# Patient Record
Sex: Female | Born: 2000 | Race: White | Hispanic: No | Marital: Single | Smoking: Never smoker
Health system: Southern US, Community
[De-identification: ages and names within clinical notes are randomized; demographics above are authoritative.]

## PROBLEM LIST (undated history)

## (undated) DIAGNOSIS — S7012XA Contusion of left thigh, initial encounter: Secondary | ICD-10-CM

## (undated) DIAGNOSIS — G8929 Other chronic pain: Secondary | ICD-10-CM

## (undated) HISTORY — PX: OTHER SURGICAL HISTORY: SHX169

---
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
## 2022-01-07 ENCOUNTER — Ambulatory Visit: Payer: Self-pay | Admitting: Occupational Therapy

## 2022-01-07 DIAGNOSIS — M79641 Pain in right hand: Secondary | ICD-10-CM

## 2022-01-07 DIAGNOSIS — M25531 Pain in right wrist: Secondary | ICD-10-CM

## 2022-01-07 DIAGNOSIS — M79631 Pain in right forearm: Secondary | ICD-10-CM

## 2022-01-07 DIAGNOSIS — G5631 Lesion of radial nerve, right upper limb: Secondary | ICD-10-CM

## 2022-01-07 NOTE — Therapy (Signed)
[facility] [address] [place], [address] Phone: [phone]   Fax:  [phone]  Occupational Therapy Treatment  Patient Details  Name: Erin Parsons MRN: [id number] Date of Birth: May 25, 2001 Referring Provider (OT): DR Candelaria Stagers   Encounter Date: 01/07/2022   OT End of Session - 01/07/22 1106     Visit Number 14    Number of Visits 18    Date for OT Re-Evaluation 01/07/22    OT Start Time 0955    OT Stop Time 1019    OT Time Calculation (min) 24 min    Activity Tolerance Patient tolerated treatment well    Behavior During Therapy [facility] for tasks assessed/performed             No past medical history on file.  No past surgical history on file.  There were no vitals filed for this visit.   Subjective Assessment - 01/07/22 1105     Subjective  I done okay with the 1 pound weight I can feel my wrists getting stronger and did not had any pain.  The only time I had numbness is when I was trying to write again and use my index finger on my mouse pad on my laptop.    Pertinent History Seen DR Avon Gully last week -The patient has ongoing right arm pains of unknown etiology after her dog pulled her right arm about 4 months ago. I explained that she could have radial neuritis versus myofascial pain versus elbow joint mediated pain versus lateral epicondylitis.  - She has done some conservative treatments at the direction of [facility] with persistent symptoms. She has previous normal x-rays of the elbow/forearm/wrist. She has a benign physical exam. I am suspicious for some radial neuritis that is resolving or near completely resolved and now she is dealing with more myofascial pains around the dorsal forearm. She could also have some element of lateral epicondylitis. Based on the intensity of her activities, I recommended prednisone to see if this can help her symptoms and referral to occupational therapy.   - Activity as tolerated. Modify activity as needed according to symptoms. No limitations to weight bearing. No need for immobilization or bracing.  - Refer to PT for pain relieving modalities, manual techniques as indicated, home exercise planning. Continue home exercise program to maintain strength, flexibility, and endurance.  - I prescribed a short course of prednisone. Use Tylenol, anti-inflammatories, topical pain cream, relative rest, massage, and ice/heat as needed for pain.   - Follow up in 8 weeks for reevaluation.    Patient Stated Goals Want the pain better so I can play piano, typing and writing without issues-want to go to college for music and piano    Currently in Pain? No/denies                        Active range of motion for right wrist in all planes within normal limits.  No pain with resistance at end range.  Patient reports she was able to do 3 sets with 1 pound for wrist in all planes pain-free.   Wrist extension and forearm in all planes increasing in strength with no reports of pain and numbness.   Had one episode of numbness over her index finger t with typing on the computer using index finger swiping on mouse pad.   Had 1 episode of pain in radial wrist with writing.    Patient  show more confidence in strength with wrist and forearm resistance by OT.  Patient fatigues still from having injury early last year from her radial nerve and being immobilized on and off with episodes of pain. As well as with fatigue of right upper extremity patient compensating with IADLs and ADLs.         Patient continues to not wear any braces in the last week.   Upgrading change home program for patient to do 2 pound weight for wrist extension, flexion as well as ulnar-radial deviation and pronation supination 1 set sets of 10 1 time a day. If pain-free patient can increase to a second and third set over the next week 1 time a day with 2 pound weight.    Patient continues  to have some tightness in webspace on the right hand with some discomfort on the radial wrist tenderness appears over the [employer]. Assess patient's handwriting again and reviewed again using enlarged grip.  If not patient abducts with thumb keeping pending place as well as she keeps wrist in slight flexion during her writing and sliding over paper. This OT also things that happens with her playing piano and working on the computer wrist extension little weaker since the old injury of the radial nerve causing some numbness. Strengthening should help with the numbness and the collapsing of the wrist in flexion. Patient to work on modifying her writing pinch grip..                        OT Education - 01/07/22 1105     Education Details Progress and changes to home program    Person(s) Educated Patient    Methods Explanation;Demonstration;Tactile cues;Verbal cues;Handout    Comprehension Verbal cues required;Returned demonstration;Verbalized understanding                 OT Long Term Goals - 11/26/21 1821       OT LONG TERM GOAL #1   Title Pt pain in R arm decrease for pt to wean out of splint and have symptoms less than 2 x day    Baseline painat eval constant with use and 4-5/10 - no pain except playing 45 min intense piano piece and writing for 30 min - 1-2 x day maybeNOW patient return with increased pain on radial wrist and dorsal hand 4/10 pain, 3/10 pain in webspace, patient fitted with a wrist splint again we will assess if needed thumb spica.    Time 4    Period Weeks    Status On-going    Target Date 12/31/21      OT LONG TERM GOAL #2   Title Pain in R arm decrease to 1 -2 episodes a  wk and no stiffness    Baseline Patient reports pain to-5/10 over radial wrist into dorsal hand to second digit and thumb.  3/10 pain and tightness in webspace, pain at rest today for/10 pain pain with radial deviation wrist extension and flexion    Time 6    Period Weeks     Status On-going    Target Date 01/07/22                   Plan - 01/07/22 1107     Clinical Impression Statement Pt  was seen last year end for diagnosis of R dominant forearm pain/radial N neuritis-  In March/April 22 she was holding onto her dogs leash and got pulled - symptoms increased  as she started back doing her activities- she reports 4-5/10 pain in dorsal and radial forearm, wrist and dorsal thumb thru 2nd digit after repetitive digits extention, static wrist extention,pronation position and thumb RA in acitivities like playing piano, typing, writing at eval. She made great progress and recover about 90 % - when to college and had scholarship for music.  Patient reports the month before she went to college she had to play a lot of chest and recitals and had increased pain in wrist and hand.  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  Patient was evaluated 4-5 weeks ago after not seen 5 months with reports of increased pain over the radial wrist and dorsal hand, with some numbness at times in second digit and thumb.  NOW patient showing great progress in active range of motion and resistance in wrist for all planes was able to upgrade patient to 2 pound weight in all planes pain-free.  Patient can increase in over the next few weeks to a second or third set once a day.  Patient continues to have some numbness with working on the computer and second digit as well as writing for long.Marland Kitchen  Appears patient irritates the radial nerve sensory branch.  Patient continues to have a modified pencil grip with abduction of the thumb and hyperflexion of the IP which cause tightness in the webspace discomfort over the radial wrist with palmar radial abduction.  And wrist extension weakness with writing and typing patient's wrist continues to drop or she keeps it in a slight flexion.  Should improve as patient's wrist is  getting stronger and patient can modify her pencil grip and maintain wrist neutral position during typing on computer use..  Patient also to get with tennis coach about her grip during serving-patient snaps wrist.  With patient having nerve injury about a year ago and being immobilized on and off with pain and sensory changes patient has decreased strength in right upper extremity compensating with functional task.  Patient limited in functional use of right dominant hand and playing piano, writing, working on computer and everyday ADLs and IADLs.  Patient can benefit from OT services    OT Occupational Profile and History Problem Focused Assessment - Including review of records relating to presenting problem    Occupational performance deficits (Please refer to evaluation for details): IADL's;Rest and Sleep;Play;Leisure;Social Participation    Body Structure / Function / Physical Skills Strength;Pain;UE functional use;IADL;Fascial restriction;Sensation;Flexibility;Decreased knowledge of precautions    Rehab Potential Good    Clinical Decision Making Limited treatment options, no task modification necessary    Comorbidities Affecting Occupational Performance: None    Modification or Assistance to Complete Evaluation  No modification of tasks or assist necessary to complete eval    OT Frequency 1x / week    OT Duration 6 weeks    OT Treatment/Interventions Self-care/ADL training;Contrast Bath;Ultrasound;Manual Therapy;Patient/family education;Therapeutic activities;Splinting;Iontophoresis;Fluidtherapy    Consulted and Agree with Plan of Care Patient             Patient will benefit from skilled therapeutic intervention in order to improve the following deficits and impairments:   Body Structure / Function / Physical Skills: Strength, Pain, UE functional use, IADL, Fascial restriction, Sensation, Flexibility, Decreased knowledge of precautions       Visit Diagnosis: Pain in right  forearm  Pain in right hand  Pain in right wrist  Radial  nerve dysfunction, right    Problem List There are no problems to display for this patient.   Rosalyn Gess, OTR/L,CLT 01/07/2022, 11:14 AM  [facility] [address] [place], [address] Phone: [phone]   Fax:  [phone]  Name: Erin Parsons MRN: [id number] Date of Birth: 08-28-2000

## 2022-01-14 ENCOUNTER — Ambulatory Visit: Payer: Self-pay | Admitting: Occupational Therapy

## 2022-01-14 DIAGNOSIS — M79631 Pain in right forearm: Secondary | ICD-10-CM

## 2022-01-14 DIAGNOSIS — M25531 Pain in right wrist: Secondary | ICD-10-CM

## 2022-01-14 DIAGNOSIS — G5631 Lesion of radial nerve, right upper limb: Secondary | ICD-10-CM

## 2022-01-14 DIAGNOSIS — M79641 Pain in right hand: Secondary | ICD-10-CM

## 2022-01-14 NOTE — Therapy (Signed)
[place] [facility] PHYSICAL AND SPORTS MEDICINE [address] [place], [place], [address] Phone: [phone]   Fax:  [phone]  Occupational Therapy Treatment  Patient Details  Name: Erin DarnerCarolyn Sam MRN: [id number] Date of Birth: 04/04/2001 Referring Provider (OT): DR Landry MellowKubinski   Encounter Date: 01/14/2022   OT End of Session - 01/14/22 1340     Visit Number 15    Number of Visits 18    Date for OT Re-Evaluation 02/25/22    OT Start Time 1314    OT Stop Time 1340    OT Time Calculation (min) 26 min    Activity Tolerance Patient tolerated treatment well    Behavior During Therapy [facility] for tasks assessed/performed             No past medical history on file.  No past surgical history on file.  There were no vitals filed for this visit.   Subjective Assessment - 01/14/22 1338     Subjective  I done the 2 pounds is okay but I did not go up to a second or third set.  I was doing fine and then Saturday my shoulder hurt and then went down to my forearm.  But I do not know what I done.  And then the next day I felt some numbness in my forearm, fingers.  But today is fine. I can't really remember what I have done to cause it.   [facility]    Pertinent History Seen DR Dola FactorKubunski last week -The patient has ongoing right arm pains of unknown etiology after her dog pulled her right arm about 4 months ago. I explained that she could have radial neuritis versus myofascial pain versus elbow joint mediated pain versus lateral epicondylitis.  - She has done some conservative treatments at the direction of [facility] with persistent symptoms. She has previous normal x-rays of the elbow/forearm/wrist. She has a benign physical exam. I am suspicious for some radial neuritis that is resolving or near completely resolved and now she is dealing with more myofascial pains around the dorsal forearm. She could also have some element of lateral  epicondylitis. Based on the intensity of her activities, I recommended prednisone to see if this can help her symptoms and referral to occupational therapy.  - Activity as tolerated. Modify activity as needed according to symptoms. No limitations to weight bearing. No need for immobilization or bracing.  - Refer to PT for pain relieving modalities, manual techniques as indicated, home exercise planning. Continue home exercise program to maintain strength, flexibility, and endurance.  - I prescribed a short course of prednisone. Use Tylenol, anti-inflammatories, topical pain cream, relative rest, massage, and ice/heat as needed for pain.   - Follow up in 8 weeks for reevaluation.    Patient Stated Goals Want the pain better so I can play piano, typing and writing without issues-want to go to college for music and piano    Currently in Pain? No/denies                Active range of motion for right wrist  and forearm in all planes within normal limits.  No pain with resistance at end range.  Patient reports she did not increase to 3 sets with a 2 pound weight.  Still doing 1 set of 2 pound weight 3-4 times this past week.   Strength in wrist and forearm in all planes increasing  with no reports  of pain and numbness this date.   Had one episode of numbness over her index finger with the writing but not as bad as in the past.  She do report that that symptoms was the day after her shoulder bother her little bit and the pain went down to her elbow dorsal forearm.  Could not related to any specific activity.      Patient show more confidence in strength with wrist and forearm resistance by OT.  As well as less strength with gripping and not collapsing in wrist flexion except with supination resistance, gripping tennis racquet or writing-appear patient over gripping distally to compensate for proximal strength at shoulder.   Patient fatigues still from having injury early last year from her radial nerve  and being immobilized on and off with episodes of pain. Shoulder active range of motion within normal limits but patient did report having some symptoms the last time she seen Dr. Landry Mellow. But did not show any symptoms or mention any issues the last 6 weeks.  Patient continues to not wear any braces in the last week.   Continue with same home program for patient to do 2 pound weight for wrist extension, flexion as well as ulnar-radial deviation and pronation supination but patient to do 3 sets of 12   Tightness and pain in webspace on the right hand improved greatly with no pain with thumb palmar, radial abduction as well as flexion.    Assess patient's handwriting last week reviewed again using enlarged grip.  If not patient abducts with thumb keeping pending place as well as she keeps wrist in slight flexion during her writing and sliding over paper. This OT also things that happens with her playing piano and working on the computer wrist extension little weaker since the old injury of the radial nerve causing some numbness. Strengthening should help with the numbness and the collapsing of the wrist in flexion. Patient wrist and forearm increase in strength but shoulder and proximal stability is needed would recommend at this time a PT evaluation and treat.  We will send order to Dr. Landry Mellow for PT                    OT Education - 01/14/22 1340     Education Details Progress and changes to home program    Person(s) Educated Patient    Methods Explanation;Demonstration;Tactile cues;Verbal cues;Handout    Comprehension Verbal cues required;Returned demonstration;Verbalized understanding                 OT Long Term Goals - 01/14/22 1734       OT LONG TERM GOAL #1   Title Pt pain in R arm decrease for pt to wean out of splint and have symptoms less than 2 x day    Baseline patient return with increased pain on radial wrist and dorsal hand 4/10 pain, 3/10 pain in  webspace, patient fitted with a wrist splint again we will assess if needed thumb spica. NOW patient not wearing any splints for the last 2 weeks show increase strength and 2 days out of the week with some increased symptoms of numbness more than pain.    Status Achieved      OT LONG TERM GOAL #2   Title Pain in R arm decrease to 1 -2 episodes a  wk and no stiffness    Baseline Patient reports pain to-5/10 over radial wrist into dorsal hand to second digit and thumb.  3/10 pain and tightness in webspace, pain at rest today 4/10 pain pain with radial deviation wrist extension and flexion NOW patient reports no stiffness no pain last few sessions coming in, no wearing of splints.  Patient has some numbness about 2 days a week    Status Achieved      OT LONG TERM GOAL #3   Title Right wrist and forearm strength increased to 5/5 for patient to not collapse into wrist flexion and be able to stabilize wrist neutral with using right dominant hand and IADLs and ADLs    Baseline Patient able to stabilize wrist better at this time compared to 4 to 6 weeks ago.  Still still compensates with wrist flexion during supination as well as playing tennis or writing not able to stabilize wrist neutrally.    Time 6    Period Weeks    Status New    Target Date 03/04/22                   Plan - 01/14/22 1341     Clinical Impression Statement Pt  was seen last year end for diagnosis of R dominant forearm pain/radial N neuritis-  In March/April 22 she was holding onto her dogs leash and got pulled - symptoms increased  as she started back doing her activities- she reports 4-5/10 pain in dorsal and radial forearm, wrist and dorsal thumb thru 2nd digit after repetitive digits extention, static wrist extention,pronation position and thumb RA in acitivities like playing piano, typing, writing at eval. She made great progress and recover about 90 % - when to college and had scholarship for music.  Patient reports  the month before she went to college she had to play a lot of practice and recitals and had increased pain in wrist and hand.  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  Patient was evaluated 6 weeks ago after not seen 5 months with reports of increased pain over the radial wrist and dorsal hand, with some numbness at times in second digit and thumb.  NOW patient showing great progress in active range of motion for wrist and forearm without increase symptoms-and strength improving to 4/5 doing 2 pound weight pain-free and able to stabilize wrist better.  Upon further assessment.  Patient do have some of her symptoms increase after doing something like serving in tennis, on the computer/playing piano or writing for long time.  Appear patient over gripping objects secondary to decreased strength and unable to stabilize proximally.  Shoulder active range of motion is within normal limits but patient did mention when seen by Dr. Landry Mellow last time that her shoulder was bothering her but did not mention it last 6 weeks.  Would recommend PT evaluation at this time to address proximal strength and stability to decrease possibly compensating distally with the right dominant hand.  Pt has been favoring R UE more than year from original injury. Patient limited in functional use of right dominant hand playing  tennis, piano, writing, working on computer and everyday ADLs and IADLs.  Patient can benefit from cont OT services    OT Occupational Profile and History Problem Focused Assessment - Including review of records relating to presenting problem    Occupational performance deficits (Please refer to evaluation for details): IADL's;Rest and Sleep;Play;Leisure;Social Participation    Body Structure / Function / Physical Skills Strength;Pain;UE functional use;IADL;Fascial restriction;Sensation;Flexibility;Decreased knowledge  of precautions     Rehab Potential Good    Clinical Decision Making Limited treatment options, no task modification necessary    Comorbidities Affecting Occupational Performance: None    Modification or Assistance to Complete Evaluation  No modification of tasks or assist necessary to complete eval    OT Frequency 1x / week   decrease to biweekly as progress   OT Duration 6 weeks    OT Treatment/Interventions Self-care/ADL training;Contrast Bath;Ultrasound;Manual Therapy;Patient/family education;Therapeutic activities;Splinting;Iontophoresis;Fluidtherapy    Consulted and Agree with Plan of Care Patient             Patient will benefit from skilled therapeutic intervention in order to improve the following deficits and impairments:   Body Structure / Function / Physical Skills: Strength, Pain, UE functional use, IADL, Fascial restriction, Sensation, Flexibility, Decreased knowledge of precautions       Visit Diagnosis: Pain in right forearm - Plan: Ot plan of care cert/re-cert  Pain in right hand - Plan: Ot plan of care cert/re-cert  Pain in right wrist - Plan: Ot plan of care cert/re-cert  Radial nerve dysfunction, right - Plan: Ot plan of care cert/re-cert    Problem List There are no problems to display for this patient.   Oletta Cohn, OTR/L,CLT 01/14/2022, 5:43 PM  [place] [facility] [place] PHYSICAL AND SPORTS MEDICINE [address], [place], [address] Phone: [phone]   Fax:  [phone]  Name: Marry Kusch MRN: [id number] Date of Birth: 05-18-2001

## 2022-01-21 ENCOUNTER — Ambulatory Visit: Payer: Self-pay | Admitting: Occupational Therapy

## 2022-01-23 ENCOUNTER — Ambulatory Visit: Payer: Self-pay | Attending: Sports Medicine | Admitting: Occupational Therapy

## 2022-01-23 DIAGNOSIS — M79641 Pain in right hand: Secondary | ICD-10-CM | POA: Insufficient documentation

## 2022-01-23 DIAGNOSIS — M25611 Stiffness of right shoulder, not elsewhere classified: Secondary | ICD-10-CM | POA: Insufficient documentation

## 2022-01-23 DIAGNOSIS — G5631 Lesion of radial nerve, right upper limb: Secondary | ICD-10-CM | POA: Insufficient documentation

## 2022-01-23 DIAGNOSIS — M79631 Pain in right forearm: Secondary | ICD-10-CM | POA: Insufficient documentation

## 2022-01-23 DIAGNOSIS — M25531 Pain in right wrist: Secondary | ICD-10-CM | POA: Insufficient documentation

## 2022-01-26 ENCOUNTER — Encounter: Payer: Self-pay | Admitting: Occupational Therapy

## 2022-01-26 NOTE — Therapy (Signed)
[place] [facility] PHYSICAL AND SPORTS MEDICINE [address] [place], [address] Phone: [phone]   Fax:  [phone]  Occupational Therapy Treatment  Patient Details  Name: Erin Parsons MRN: [id number] Date of Birth: [birth year] Referring Provider (OT): DR Landry Mellow   Encounter Date: 01/23/2022   OT End of Session - 01/26/22 2008     Visit Number 16    Number of Visits 18    Date for OT Re-Evaluation 02/25/22    OT Start Time 0945    OT Stop Time 1029    OT Time Calculation (min) 44 min    Activity Tolerance Patient tolerated treatment well    Behavior During Therapy [facility] for tasks assessed/performed             History reviewed. No pertinent past medical history.  History reviewed. No pertinent surgical history.  There were no vitals filed for this visit.   Subjective Assessment - 01/26/22 2008     Pertinent History Seen DR Dola Factor last week -The patient has ongoing right arm pains of unknown etiology after her dog pulled her right arm about 4 months ago. I explained that she could have radial neuritis versus myofascial pain versus elbow joint mediated pain versus lateral epicondylitis.  - She has done some conservative treatments at the direction of [facility] with persistent symptoms. She has previous normal x-rays of the elbow/forearm/wrist. She has a benign physical exam. I am suspicious for some radial neuritis that is resolving or near completely resolved and now she is dealing with more myofascial pains around the dorsal forearm. She could also have some element of lateral epicondylitis. Based on the intensity of her activities, I recommended prednisone to see if this can help her symptoms and referral to occupational therapy.  - Activity as tolerated. Modify activity as needed according to symptoms. No limitations to weight bearing. No need for immobilization or bracing.  - Refer to PT for pain relieving modalities, manual  techniques as indicated, home exercise planning. Continue home exercise program to maintain strength, flexibility, and endurance.  - I prescribed a short course of prednisone. Use Tylenol, anti-inflammatories, topical pain cream, relative rest, massage, and ice/heat as needed for pain.   - Follow up in 8 weeks for reevaluation.    Patient Stated Goals Want the pain better so I can play piano, typing and writing without issues-want to go to college for music and piano    Currently in Pain? Yes    Pain Score 0-No pain           Rationale for Evaluation and Treatment Rehabilitation   Reports no pain today, had a good week, did not get to do any tennis this week and so she has not been able to get with her coach yet.  Was able work up to  performing 3 sets of 12 this week of the 2# exercises this week.    Fluidotherapy to right hand for 12 mins prior to therapeutic exercises to increase tissue mobility and decrease incidence of pain.    Strength in wrist and forearm in all planes increasing  with no reports of pain and numbness.     Patient continues to not wear any braces in the last week.   Since she just worked up to 3 sets of 12 reps this week and now has no pain, recommend continuing with  2 pound weight for wrist extension, flexion as well as ulnar-radial deviation and pronation supination but patient  to perform 3 sets of 15.     Tightness and pain in webspace on the right hand improved greatly with no pain with thumb palmar, radial abduction as well as flexion.    Handwriting: Pt assessed for grasping patterns with handwriting, she tends to produce a thumb wrap grip on pen and which does not allow for much isolated movement at the thumb.  Performed handwriting in the clinic with large red grip foam to pen for name, printed and script.  She demonstrates decreased precision of movements and decreased legibility.  Recommend she continue with red foam at home to help reinforce grip, keep pain  decreased but also to add movement of opposition of thumb and index for "O" then into egg shape pushing tips of fingers forward and back.  Pt able to demonstrate exercise which should help promote facilitation of tripod gripping pattern with movement for handwriting.  Also recommend she perform handwriting in short increments. Therapist to look at [facility] to see if we have any large pen grips she can transition to in the future to use while in class.     Patient wrist and forearm increase in strength but shoulder and proximal stability is needed would recommend at this time a PT evaluation and treat.   Pt still awaiting order to Dr. Landry Mellow for PT                         OT Long Term Goals - 01/14/22 1734       OT LONG TERM GOAL #1   Title Pt pain in R arm decrease for pt to wean out of splint and have symptoms less than 2 x day    Baseline patient return with increased pain on radial wrist and dorsal hand 4/10 pain, 3/10 pain in webspace, patient fitted with a wrist splint again we will assess if needed thumb spica. NOW patient not wearing any splints for the last 2 weeks show increase strength and 2 days out of the week with some increased symptoms of numbness more than pain.    Status Achieved      OT LONG TERM GOAL #2   Title Pain in R arm decrease to 1 -2 episodes a  wk and no stiffness    Baseline Patient reports pain to-5/10 over radial wrist into dorsal hand to second digit and thumb.  3/10 pain and tightness in webspace, pain at rest today 4/10 pain pain with radial deviation wrist extension and flexion NOW patient reports no stiffness no pain last few sessions coming in, no wearing of splints.  Patient has some numbness about 2 days a week    Status Achieved      OT LONG TERM GOAL #3   Title Right wrist and forearm strength increased to 5/5 for patient to not collapse into wrist flexion and be able to stabilize wrist neutral with using right dominant hand and  IADLs and ADLs    Baseline Patient able to stabilize wrist better at this time compared to 4 to 6 weeks ago.  Still still compensates with wrist flexion during supination as well as playing tennis or writing not able to stabilize wrist neutrally.    Time 6    Period Weeks    Status New    Target Date 03/04/22                   Plan - 01/26/22 2009     Clinical  Impression Statement Pt  was seen last year end for diagnosis of R dominant forearm pain/radial N neuritis-  In March/April 22 she was holding onto her dogs leash and got pulled - symptoms increased  as she started back doing her activities- she reports 4-5/10 pain in dorsal and radial forearm, wrist and dorsal thumb thru 2nd digit after repetitive digits extention, static wrist extention,pronation position and thumb RA in acitivities like playing piano, typing, writing at eval. She made great progress and recover about 90 % - when to college and had scholarship for music.  Patient reports the month before she went to college she had to play a lot of practice and recitals and had increased pain in wrist and hand.  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  Patient was evaluated 6 weeks ago after not seen 5 months with reports of increased pain over the radial wrist and dorsal hand, with some numbness at times in second digit and thumb.  Pt with no pain this date and reports progress overall, she did not get to engage in tennis or talk with coach about her tennis movement patterns.    Appear patient over gripping objects secondary to decreased strength and unable to stabilize proximally.  Pt continues to report shoulder weakness.  She demonstrates a thumb wrap grip on pen for handwriting and would benefit from large grip pen with progression of grips over time to improve strength and precision of thumb and index tripod grasp as well as improving  legibility.    Would recommend PT evaluation at this time to address proximal strength and stability to decrease possibly compensating distally with the right dominant hand.  Pt has been favoring R UE more than year from original injury. Patient limited in functional use of right dominant hand playing  tennis, piano, writing, working on computer and everyday ADLs and IADLs.  Patient can benefit from cont OT services    OT Occupational Profile and History Problem Focused Assessment - Including review of records relating to presenting problem    Occupational performance deficits (Please refer to evaluation for details): IADL's;Rest and Sleep;Play;Leisure;Social Participation    Body Structure / Function / Physical Skills Strength;Pain;UE functional use;IADL;Fascial restriction;Sensation;Flexibility;Decreased knowledge of precautions    Rehab Potential Good    Clinical Decision Making Limited treatment options, no task modification necessary    Comorbidities Affecting Occupational Performance: None    Modification or Assistance to Complete Evaluation  No modification of tasks or assist necessary to complete eval    OT Frequency 1x / week   decrease to biweekly as progress   OT Duration 6 weeks    OT Treatment/Interventions Self-care/ADL training;Contrast Bath;Ultrasound;Manual Therapy;Patient/family education;Therapeutic activities;Splinting;Iontophoresis;Fluidtherapy    Consulted and Agree with Plan of Care Patient             Patient will benefit from skilled therapeutic intervention in order to improve the following deficits and impairments:   Body Structure / Function / Physical Skills: Strength, Pain, UE functional use, IADL, Fascial restriction, Sensation, Flexibility, Decreased knowledge of precautions       Visit Diagnosis: Pain in right forearm  Pain in right hand  Pain in right wrist  Radial nerve dysfunction, right    Problem List There are no problems to display for this  patient.  Arvis Miguez Cornelius Moras, OTR/L, CLT  Aerin Delany, OT 01/26/2022, 8:33 PM  [place] [facility]  PHYSICAL AND SPORTS MEDICINE [address] , [place], [address] Phone: [phone]   Fax:  [phone]  Name: Erin Parsons MRN: [id number] Date of Birth: 10/21/2000

## 2022-02-04 ENCOUNTER — Ambulatory Visit: Payer: Self-pay | Admitting: Occupational Therapy

## 2022-02-11 ENCOUNTER — Ambulatory Visit: Payer: Self-pay | Admitting: Occupational Therapy

## 2022-02-11 DIAGNOSIS — M25531 Pain in right wrist: Secondary | ICD-10-CM

## 2022-02-11 DIAGNOSIS — M79641 Pain in right hand: Secondary | ICD-10-CM

## 2022-02-11 DIAGNOSIS — M79631 Pain in right forearm: Secondary | ICD-10-CM

## 2022-02-11 DIAGNOSIS — G5631 Lesion of radial nerve, right upper limb: Secondary | ICD-10-CM

## 2022-02-14 ENCOUNTER — Ambulatory Visit: Payer: Self-pay | Admitting: Physical Therapy

## 2022-02-14 ENCOUNTER — Encounter: Payer: Self-pay | Admitting: Physical Therapy

## 2022-02-14 DIAGNOSIS — M25611 Stiffness of right shoulder, not elsewhere classified: Secondary | ICD-10-CM

## 2022-02-14 NOTE — Therapy (Signed)
[facility] [address], [place], [address] Phone: [phone]   Fax:  [phone]  Physical Therapy Evaluation  Patient Details  Name: Erin Parsons MRN: [id number] Date of Birth: Aug 29, 2000 No data recorded  Encounter Date: 02/14/2022     History reviewed. No pertinent past medical history.  History reviewed. No pertinent surgical history.  There were no vitals filed for this visit.    Subjective Assessment - 02/14/22 1002     Pertinent History Patinet is a 21 year old female familiar with this clinic from recenting d/c OT for R radial neuritis. She had some shoulder pain at beginning of OT, but says it has gotten worse since she has not been using it as much due to healing from neutritis. Pain is localized to superior/posterior shoulder and UT. Reports this is achy and reports her UT feels really tight. She cannot localize any activities that make her pain better or worse. Current and worst pain 1/10; best 0/10. Has not tried any modalities to help with shoulder discomfort . She notices some weakness in her R shoulder, and that it is lower than her L shoulder, and she feels like she is weak when serving for tennis. Pt is attending [employer] this August. 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 Pt is R handed.Still has some numbness from neurtiis with writing or playing piano for extended period of time Pt denies N/V, B&B changes, unexplained weight fluctuation, saddle paresthesia, fever, night sweats, or unrelenting night pain at this time.    Limitations Lifting;House hold activities;Writing    How long can you sit comfortably? unlimited with support, without d/t "feeling like I have to old my shoulder up"    How long can you stand comfortably? unlimited    How long can you walk comfortably? unlimited    Diagnostic tests none    Patient Stated Goals increase R  soulder strength    Currently in Pain? Yes    Pain Score 1     Pain Location Shoulder    Pain Orientation Right;Upper;Posterior    Pain Descriptors / Indicators Aching;Dull    Pain Type Chronic pain    Pain Radiating Towards none; some numbess elbow down fingers from radial neuritis    Pain Onset More than a month ago    Pain Frequency Intermittent    Aggravating Factors  sitting mre than , serving in tennis    Pain Relieving Factors none    Effect of Pain on Daily Activities unable to play tennis or sit without support                OBJECTIVE  MUSCULOSKELETAL: Tremor: Normal Bulk: Normal Tone: Normal  Cervical Screen AROM: WFL and painless with overpressure in all planes Spurlings A (ipsilateral lateral flexion/axial compression): R: Negative L: Negative Spurlings B (ipsilateral lateral flexion/contralateral rotation/axial compression): R: Negative L: Negative Repeated movement: No centralization or peripheralization with protraction or retraction  Elbow Screen Elbow AROM: pain free; slight hypermobility in ext  Palpation TTP with concordant pain sign to R UT and R levator with trigger points and tension  Observation: decreased R shoulder height, slight increased thoracic kyphosis FHRS With overhead motion: scapular dyskinesis increased scapular elevation  Strength R/L 5/5 Shoulder flexion (anterior deltoid/pec major/coracobrachialis, axillary n. (C5-6) and musculocutaneous n. (C5-7)) 5/5 Shoulder abduction (deltoid/supraspinatus, axillary/suprascapular n, C5) 5/5 Shoulder external rotation (infraspinatus/teres minor) 5/5 Shoulder internal rotation (subcapularis/lats/pec  major) 5/5 Shoulder extension (posterior deltoid, lats, teres major, axillary/thoracodorsal n.) 5/5 Elbow flexion (biceps brachii, brachialis, brachioradialis, musculoskeletal n, C5-6) 5/5 Shrug UT 4/5 Y lower trap 4+/5 T scapular retractors  AROM R/L All AROM  (bilat shoulder,  scapulae, cervical and thoracic spine) within normal limits some shoulder hypermobility noted in all directions  Beighton scale 2/9 Elbow 2 Pinky 0 Thumb 0 Knees 0 Trunk 0  *Indicates pain, overpressure performed unless otherwise indicated  PROM R/L 190/190 Shoulder flexion >180d bilat Shoulder abduction 109/107 Shoulder external rotation 90d bilat Shoulder internal rotation Beyond 60d bilat Shoulder extension *Indicates pain, overpressure performed unless otherwise indicated  Accessory Motions/Glides Glenohumeral: Posterior: R: normal L: normal Inferior: R: normal L: normal Anterior: R: normal L: normal Laxity with all joint motions  Scapulothoracic: Distraction: R: normal L: normal Medial: R: normal L: normal Lateral: R: normal L: normal Inferior: R: normal L: normal Superior: R: normal L: normal  Muscle Length Testing Pectoralis Major: R: normal L: normal Pectoralis Minor: R: normal L: normal Biceps: R: normal L: normal  NEUROLOGICAL: Sensation Grossly intact to light touch bilateral UE as determined by testing dermatomes C2-T2 Proprioception and hot/cold testing deferred on this date  Special tests Subacromial Impingement Hawkins-Kennedy: Negative Neer (Block scapula, PROM flexion): Negative Painful Arc (Pain from 60 to 120 degrees scaption): Negative Empty Can: Negative External Rotation Resistance: Negative Horizontal Adduction: Negative Scapular Assist: Negative Positive Hawkins-Kennedy, Painful arc sign, Infraspinatus muscle test then +LR: 10.56 of some type of impingement present, 2/3 tests: +LR 5.06, -LR 0.17, Positive 3/5 Hawkins-Kennedy, neer, painful arc, empty can, and external rotation resistance then SN: .75 (.54-.96) SP: .74 (.61-.88) +LR: 2.93 (1.60-5.36) -LR: .34 (.14-.80)    Shoulder Instability Sulcus Sign: Negative Anterior Apprehension: Negative  Ther-Ex PT reviewed the following HEP with patient with patient able to demonstrate a  set of the following with min cuing for correction needed. PT educated patient on parameters of therex (how/when to inc/decrease intensity, frequency, rep/set range, stretch hold time, and purpose of therex) with verbalized understanding.   Access Code: [email or web address] - Seated Cervical Sidebending Stretch  - 2-3 x daily - 7 x weekly - 30-60sec hold - Seated Levator Scapulae Stretch  - 2-3 x daily - 7 x weekly - 30-60sec hold - Prone Single Arm Shoulder Y with Dumbbell  - 1 x daily - 1-2 x weekly - 3 sets - 6-10 reps - Shoulder Extension with Resistance  - 1 x daily - 1-2 x weekly - 3 sets - 6-10 reps                 Objective measurements completed on examination: See above findings.                PT Education - 02/14/22 1011     Education Details Patient was educated on diagnosis, anatomy and pathology involved, prognosis, role of PT, and was given an HEP, demonstrating exercise with proper form following verbal and tactile cues, and was given a paper hand out to continue exercise at home. Pt was educated on and agreed to plan of care.    Person(s) Educated Patient    Methods Explanation;Demonstration;Tactile cues;Verbal cues;Handout    Comprehension Verbalized understanding;Returned demonstration;Verbal cues required;Tactile cues required              PT Short Term Goals - 02/14/22 1044       PT SHORT TERM GOAL #1   Title Pt will be independent with HEP in order to improve  strength and decrease pain in order to improve pain-free function at home and work.    Baseline 02/14/22 HEP given    Time 4    Period Weeks    Status New               PT Long Term Goals - 02/14/22 1045       PT LONG TERM GOAL #1   Title Pt will demonstrate gross periscapular strength of 5/5 in order to demonstrate PLOF and strength needed for tennis serve    Baseline 02/14/22 Y lower trap 4/5; T scap retractors 4+/5    Time 8    Period Weeks    Status New      PT LONG TERM  GOAL #2   Title Patient will increase FOTO score to 77  to demonstrate predicted increase in function to complete ADLs    Baseline 02/14/22 55    Time 8    Period Weeks    Status New                    Plan - 02/14/22 1046     Clinical Impression Statement Pt is an 21 year old female presenting with R shoulder pain and weakness following disuse from radial neuritis. Impairments in decreased periscapular strengt, decreased power, scapular diskinesis, hypermobility of bilat scapulohumeral complex, abnormal posture, increased tension/trigger points of UT and levator scapulae, and pain. 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 Pt will benefit from skilled PT to address aforementioned impairments to return to optimal PLOF    Personal Factors and Comorbidities Time since onset of injury/illness/exacerbation;Comorbidity 1    Comorbidities radial neuritis    Examination-Activity Limitations Sit;Lift   swing   Examination-Participation Restrictions Community Activity   tennis   Stability/Clinical Decision Making Stable/Uncomplicated    Clinical Decision Making Low    Rehab Potential Good    PT Frequency 2x / week    PT Duration 8 weeks    PT Treatment/Interventions ADLs/Self Care Home Management;Cryotherapy;Traction;Gait training;Therapeutic exercise;Patient/family education;Manual techniques;Passive range of motion;Dry needling;Moist Heat;DME Instruction;Therapeutic activities;Iontophoresis 4mg /ml Dexamethasone;Electrical Stimulation;Ultrasound;Functional mobility training;Neuromuscular re-education;Joint Manipulations;Spinal Manipulations    PT Next Visit Plan TDN?    PT Home Exercise Plan Y 2# DB, low row, UT and levator stretch    Consulted and Agree with Plan of Care Patient             Patient will benefit from skilled therapeutic intervention in  order to improve the following deficits and impairments:  Decreased mobility, Increased muscle spasms, Improper body mechanics, Impaired tone, Decreased range of motion, Decreased activity tolerance, Decreased safety awareness, Decreased strength, Increased fascial restricitons, Impaired UE functional use, Postural dysfunction, Pain  Visit Diagnosis: Stiffness of right shoulder, not elsewhere classified     Problem List There are no problems to display for this patient.  DPT Hilda Lias, PT 02/14/2022, 10:52 AM  [facility] SPORTS MEDICINE [address], [place] Phone: [phone]   Fax:  [phone]  Name: Erin Parsons MRN: Linna Darner Date of Birth: November 05, 2000

## 2022-02-17 ENCOUNTER — Ambulatory Visit: Payer: Self-pay | Attending: Sports Medicine

## 2022-02-17 DIAGNOSIS — M79641 Pain in right hand: Secondary | ICD-10-CM

## 2022-02-17 DIAGNOSIS — M25611 Stiffness of right shoulder, not elsewhere classified: Secondary | ICD-10-CM

## 2022-02-17 DIAGNOSIS — M79631 Pain in right forearm: Secondary | ICD-10-CM

## 2022-02-17 DIAGNOSIS — M25531 Pain in right wrist: Secondary | ICD-10-CM

## 2022-02-17 NOTE — Therapy (Signed)
[facility] [address], [place], [address] Phone: [phone]   Fax:  [phone]  Physical Therapy Treatment  Patient Details  Name: Erin Parsons MRN: [id number] Date of Birth: [birth year] No data recorded  Encounter Date: 02/17/2022   PT End of Session - 02/17/22 1525     Visit Number 2    Number of Visits 18    Date for PT Re-Evaluation 04/17/22    Authorization Time Period 02/14/22-04/16/22    PT Start Time 1520    PT Stop Time 1600    PT Time Calculation (min) 40 min    Activity Tolerance Patient tolerated treatment well;No increased pain    Behavior During Therapy [facility] for tasks assessed/performed             No past medical history on file.  No past surgical history on file.  There were no vitals filed for this visit.   Subjective Assessment - 02/17/22 1522     Subjective Pt reports she has already noticed some improvements in her    Pertinent History Patinet is a 21 year old female familiar with this clinic from recenting d/c OT for R radial neuritis. She had some shoulder pain at beginning of OT, but says it has gotten worse since she has not been using it as much due to healing from neutritis. Pain is localized to superior/posterior shoulder and UT. Reports this is achy and reports her UT feels really tight. She cannot localize any activities that make her pain better or worse. Current and worst pain 1/10; best 0/10. Has not tried any modalities to help with shoulder discomfort . She notices some weakness in her R shoulder, and that it is lower than her L shoulder, and she feels like she is weak when serving for tennis. Pt is attending [employer] this August. 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 Pt is R handed.Still has some numbness from neurtiis with writing or playing piano for extended period of time Pt denies N/V, B&B changes, unexplained weight  fluctuation, saddle paresthesia, fever, night sweats, or unrelenting night pain at this time.    Currently in Pain? No/denies   just stiff           INTERVENTION 02/17/22  RUE resistance training -Shrugs 5lb FW 1X15 BILAT  -standing Rt elbow flexion 1x15 @ 5lb -bilat shoulder abduction 1x12 @ 2lb (similar)  -standing BUE GHJ ER, elbows at side 90 degrees, hands supinated 1x15 @ redTB  -wall pushups on treadmill bar 1x15, ft 30 inches back  -standing cable row c scap retraction depression 1x12 @ 15lb (extensive cues for throacic extension)   -Shrugs 5lb FW 1X15 BILAT  -standing Rt elbow flexion 1x15 @ 5lb -bilat shoulder abduction 1x12 @ 2lb (similar)  -standing BUE GHJ ER, elbows at side 90 degrees, hands supinated 1x15 @ GreenTB  -wall pushups on treadmill bar 1x15, ft 30 inches back  -standing cable row c scap retraction depression 1x12 @ 15lb (extensive cues for throacic extension)   Motor control, stabilization training -prone plank to single arm rotation 1x8 bilat (Rt side feels weaker here)   Manual therapy -sustained release Rt levator scapulae x2 minutes (decrease pain with palpable release of tissue) -ART to Rt levator scap origin paired with cervical left rotation in supine x12, then mid belly levator paired with shoulder extension from 90 degrees flexion x8     PT Short  Term Goals - 02/14/22 1044       PT SHORT TERM GOAL #1   Title Pt will be independent with HEP in order to improve strength and decrease pain in order to improve pain-free function at home and work.    Baseline 02/14/22 HEP given    Time 4    Period Weeks    Status New               PT Long Term Goals - 02/14/22 1045       PT LONG TERM GOAL #1   Title Pt will demonstrate gross periscapular strength of 5/5 in order to demonstrate PLOF and strength needed for tennis serve    Baseline 02/14/22 Y lower trap 4/5; T scap retractors 4+/5    Time 8    Period Weeks    Status New      PT LONG  TERM GOAL #2   Title Patient will increase FOTO score to 77  to demonstrate predicted increase in function to complete ADLs    Baseline 02/14/22 55    Time 8    Period Weeks    Status New                   Plan - 02/17/22 1543     Clinical Impression Statement HEP assignment successful. Symptoms improving. Strength work commenced. Cues needed intermittently for accuracy and form. Sx stable throughout session. No fatigue or pain noted. No radial nn pain. Pt progressing well toward goal of treatment.    Personal Factors and Comorbidities Time since onset of injury/illness/exacerbation;Comorbidity 1    Comorbidities radial neuritis    Examination-Activity Limitations Sit;Lift    Examination-Participation Restrictions Community Activity    Stability/Clinical Decision Making Stable/Uncomplicated    Clinical Decision Making Low    Rehab Potential Good    PT Frequency 2x / week    PT Duration 8 weeks    PT Treatment/Interventions ADLs/Self Care Home Management;Cryotherapy;Traction;Gait training;Therapeutic exercise;Patient/family education;Manual techniques;Passive range of motion;Dry needling;Moist Heat;DME Instruction;Therapeutic activities;Iontophoresis 4mg /ml Dexamethasone;Electrical Stimulation;Ultrasound;Functional mobility training;Neuromuscular re-education;Joint Manipulations;Spinal Manipulations    PT Next Visit Plan continue with strengthening regimen, progress to    PT Home Exercise Plan Eval: Y 2# DB, low row, UT and levator stretch    Consulted and Agree with Plan of Care Patient             Patient will benefit from skilled therapeutic intervention in order to improve the following deficits and impairments:  Decreased mobility, Increased muscle spasms, Improper body mechanics, Impaired tone, Decreased range of motion, Decreased activity tolerance, Decreased safety awareness, Decreased strength, Increased fascial restricitons, Impaired UE functional use, Postural  dysfunction, Pain  Visit Diagnosis: Stiffness of right shoulder, not elsewhere classified  Pain in right forearm  Pain in right hand  Pain in right wrist     Problem List There are no problems to display for this patient.  4:02 PM, 02/17/22 04/20/22, PT, DPT Physical Therapist - [place] [phone] (Office)   [place] C, PT 02/17/2022, 3:59 PM  [place] [facility] [address], [place] Phone: [phone]   Fax:  [phone]  Name: Erin Parsons MRN: Linna Darner Date of Birth: 2000-09-24

## 2022-02-20 ENCOUNTER — Ambulatory Visit: Payer: Self-pay | Admitting: Physical Therapy

## 2022-02-24 ENCOUNTER — Ambulatory Visit: Payer: Self-pay | Admitting: Physical Therapy

## 2022-02-24 ENCOUNTER — Encounter: Payer: Self-pay | Admitting: Physical Therapy

## 2022-02-24 DIAGNOSIS — M25611 Stiffness of right shoulder, not elsewhere classified: Secondary | ICD-10-CM

## 2022-02-24 NOTE — Therapy (Signed)
[facility] [address], [place], [address] Phone: [phone]   Fax:  [phone]  Physical Therapy Treatment  Patient Details  Name: Erin Parsons MRN: [id number] Date of Birth: July 19, 2001 No data recorded  Encounter Date: 02/24/2022   PT End of Session - 02/24/22 0946     Visit Number 3    Number of Visits 18    Date for PT Re-Evaluation 04/17/22    Authorization Time Period 02/14/22-04/16/22    Authorization - Visit Number 3    Progress Note Due on Visit 10    PT Start Time 0820    PT Stop Time 0859    PT Time Calculation (min) 39 min    Activity Tolerance Patient tolerated treatment well;No increased pain    Behavior During Therapy [facility] for tasks assessed/performed             History reviewed. No pertinent past medical history.  History reviewed. No pertinent surgical history.  There were no vitals filed for this visit.   Subjective Assessment - 02/24/22 0927     Subjective Pt reports no pain today, she reports some soreness in her shoulder. HeP is going well, feels like with prone Y she is getting a little more spine activiation than she would like.    Pertinent History Patinet is a 21 year old female familiar with this clinic from recenting d/c OT for R radial neuritis. She had some shoulder pain at beginning of OT, but says it has gotten worse since she has not been using it as much due to healing from neutritis. Pain is localized to superior/posterior shoulder and UT. Reports this is achy and reports her UT feels really tight. She cannot localize any activities that make her pain better or worse. Current and worst pain 1/10; best 0/10. Has not tried any modalities to help with shoulder discomfort . She notices some weakness in her R shoulder, and that it is lower than her L shoulder, and she feels like she is weak when serving for tennis. Pt is attending [employer] this August. 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 Pt is R handed.Still has some numbness from neurtiis with writing or playing piano for extended period of time Pt denies N/V, B&B changes, unexplained weight fluctuation, saddle paresthesia, fever, night sweats, or unrelenting night pain at this time.    Limitations Lifting;House hold activities;Writing    How long can you sit comfortably? unlimited with support, without d/t "feeling like I have to old my shoulder up"    How long can you stand comfortably? unlimited    How long can you walk comfortably? unlimited    Diagnostic tests none    Patient Stated Goals increase R soulder strength    Pain Onset More than a month ago             Ther-Ex Nustep L4 cuing for SPM above 70 for gentle pro/retraction strengthening  Scap taps from knee plank x12 with max cuing and demo for set up with good carry over; from toes x8 with difficulty completing without rotation compensation  Alt 5# pull through from knee plank 2x 10 with max cuing and demo for set up with good carry over  RUE resistance training -Shrugs 5lb FW 1X15 BILAT  -bilat shoulder scaption 1x12 @ 2lb (similar)  -standing BUE GHJ ER, elbows at side 90 degrees, hands supinated 1x15 @  GTB  -Alt diagonal band pulls RTB x12 (6 each   -Shrugs 5lb FW 1X15 BILAT  -bilat shoulder scaption 1x12 @ 2lb (similar)  -standing BUE GHJ ER, elbows at side 90 degrees, hands supinated 1x15 @ GTB  -Alt diagonal band pulls RTB x12 (6 each  UT stretch 30sec Levator stretch 30sec Rhomboid/mid trap stretch doorway 30sec                        PT Education - 02/24/22 0946     Education Details therex form/technique    Person(s) Educated Patient    Methods Explanation;Demonstration;Verbal cues;Tactile cues    Comprehension Verbalized understanding;Returned demonstration;Verbal cues required;Tactile cues required              PT Short Term  Goals - 02/14/22 1044       PT SHORT TERM GOAL #1   Title Pt will be independent with HEP in order to improve strength and decrease pain in order to improve pain-free function at home and work.    Baseline 02/14/22 HEP given    Time 4    Period Weeks    Status New               PT Long Term Goals - 02/14/22 1045       PT LONG TERM GOAL #1   Title Pt will demonstrate gross periscapular strength of 5/5 in order to demonstrate PLOF and strength needed for tennis serve    Baseline 02/14/22 Y lower trap 4/5; T scap retractors 4+/5    Time 8    Period Weeks    Status New      PT LONG TERM GOAL #2   Title Patient will increase FOTO score to 77  to demonstrate predicted increase in function to complete ADLs    Baseline 02/14/22 55    Time 8    Period Weeks    Status New                   Plan - 02/24/22 0950     Clinical Impression Statement PT continued therex progression for increased scapulohumeral strengthening with success. Pt is able to comply with all cuing for proper technique of therex with excellent motivation and no pain or n/t throughout session. Patient with difficulty sequencing core activiation with scapular stability, able to somwhat correct this. PT will continue progression as able.    Personal Factors and Comorbidities Time since onset of injury/illness/exacerbation;Comorbidity 1    Comorbidities radial neuritis    Examination-Activity Limitations Sit;Lift    Examination-Participation Restrictions Community Activity    Stability/Clinical Decision Making Stable/Uncomplicated    Clinical Decision Making Moderate    Rehab Potential Good    PT Frequency 2x / week    PT Duration 8 weeks    PT Treatment/Interventions ADLs/Self Care Home Management;Cryotherapy;Traction;Gait training;Therapeutic exercise;Patient/family education;Manual techniques;Passive range of motion;Dry needling;Moist Heat;DME Instruction;Therapeutic activities;Iontophoresis 4mg /ml  Dexamethasone;Electrical Stimulation;Ultrasound;Functional mobility training;Neuromuscular re-education;Joint Manipulations;Spinal Manipulations    PT Next Visit Plan continue with strengthening regimen, progress to    PT Home Exercise Plan Eval: Y 2# DB, low row, UT and levator stretch    Consulted and Agree with Plan of Care Patient             Patient will benefit from skilled therapeutic intervention in order to improve the following deficits and impairments:  Decreased mobility, Increased muscle spasms, Improper body mechanics, Impaired tone, Decreased range of motion, Decreased activity tolerance,  Decreased safety awareness, Decreased strength, Increased fascial restricitons, Impaired UE functional use, Postural dysfunction, Pain  Visit Diagnosis: Stiffness of right shoulder, not elsewhere classified     Problem List There are no problems to display for this patient.  Hilda Lias DPT Hilda Lias, PT 02/24/2022, 10:04 AM  [place] [facility] AND SPORTS MEDICINE [address], [place], [address] Phone: [phone]   Fax:  [phone]  Name: Erin Parsons MRN: [id number] Date of Birth: Mar 24, 2001

## 2022-03-06 ENCOUNTER — Encounter: Payer: Self-pay | Admitting: Physical Therapy

## 2022-03-06 ENCOUNTER — Ambulatory Visit: Payer: Self-pay | Admitting: Physical Therapy

## 2022-03-06 DIAGNOSIS — M79631 Pain in right forearm: Secondary | ICD-10-CM

## 2022-03-06 DIAGNOSIS — M25611 Stiffness of right shoulder, not elsewhere classified: Secondary | ICD-10-CM

## 2022-03-06 NOTE — Therapy (Signed)
OUTPATIENT PHYSICAL THERAPY TREATMENT NOTE   Patient Name: Erin Parsons MRN: [id number] DOB:08-18-2001, 21 y.o., female Today's Date: 03/06/2022  PCP: Bronson Ing MD REFERRING PROVIDER: Dorthula Nettles DO    PT End of Session - 03/06/22 0750     Visit Number 4    Number of Visits 18    Date for PT Re-Evaluation 04/17/22    Authorization Time Period 02/14/22-04/16/22    Authorization - Visit Number 4    Progress Note Due on Visit 10    PT Start Time 0750    PT Stop Time 0820    PT Time Calculation (min) 30 min    Activity Tolerance Patient tolerated treatment well;No increased pain    Behavior During Therapy [facility] for tasks assessed/performed             History reviewed. No pertinent past medical history. History reviewed. No pertinent surgical history. There are no problems to display for this patient.   REFERRING DIAG: R shoulder pain  THERAPY DIAG:  Stiffness of right shoulder, not elsewhere classified  Pain in right forearm  Rationale for Evaluation and Treatment Rehabilitation  PERTINENT HISTORY: Patinet is a 21 year old female familiar with this clinic from recenting d/c OT for R radial neuritis. She had some shoulder pain at beginning of OT, but says it has gotten worse since she has not been using it as much due to healing from neutritis. Pain is localized to superior/posterior shoulder and UT. Reports this is achy and reports her UT feels really tight. She cannot localize any activities that make her pain better or worse. Current and worst pain 1/10; best 0/10. Has not tried any modalities to help with shoulder discomfort . She notices some weakness in her R shoulder, and that it is lower than her L shoulder, and she feels like she is weak when serving for tennis. Pt is attending [employer] this August. 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 Pt is R handed.Still has some numbness from neurtiis with writing or playing  piano for extended period of time Pt denies N/V, B&B changes, unexplained weight fluctuation, saddle paresthesia, fever, night sweats, or unrelenting night pain at this time.  PRECAUTIONS: none  SUBJECTIVE: Pt reports she is doing better overall, she has no pain today. She still reports some weakness with lifting with her RUE, but that at rest she does not notice her shoulder is "lower". She played tennis yesterday and noticed some shoulder hiking with her swing.   PAIN:  Are you having pain? No     TODAY'S TREATMENT:  Ther-Ex Nustep L4 cuing for SPM above 70 for gentle pro/retraction strengthening   Scap taps from full plank 2x 12 with min cuing for set up with good carry over  Bear crawls 2x 76ft with good carry over of demo   Wall balls (overhead wt'd ball bounce n wall) 15# 2x 20sec with min cuing initially for technique with good carry over   OMEGA cross body swing/punch 5# 2x 12 with cuing for eccentric control with good carry over  10# overhead press RUE only 2x 6 with good carry over of demo  UT stretch 30sec Levator stretch 30sec Rhomboid/mid trap stretch doorway 30sec     PATIENT EDUCATION: Education details: therex form/technique Person educated: Patient Education method: Explanation, Demonstration, and Verbal cues Education comprehension: verbalized understanding, returned demonstration, and tactile cues required   HOME EXERCISE PROGRAM: Prone Y 2# DB Low  row (band) UT and levator stretch   PT Short Term Goals - 02/14/22 1044       PT SHORT TERM GOAL #1   Title Pt will be independent with HEP in order to improve strength and decrease pain in order to improve pain-free function at home and work.    Baseline 02/14/22 HEP given    Time 4    Period Weeks    Status New              PT Long Term Goals - 02/14/22 1045       PT LONG TERM GOAL #1   Title Pt will demonstrate gross periscapular strength of 5/5 in order to demonstrate PLOF and  strength needed for tennis serve    Baseline 02/14/22 Y lower trap 4/5; T scap retractors 4+/5    Time 8    Period Weeks    Status New      PT LONG TERM GOAL #2   Title Patient will increase FOTO score to 77  to demonstrate predicted increase in function to complete ADLs    Baseline 02/14/22 55    Time 8    Period Weeks    Status New              Plan - 03/06/22 0817     Clinical Impression Statement PT continued therex progression for increased scapular and shoulder stability and strength with progression of plyometric and eccentric strengthening (needed for return to sport) as well. Patient is able to comply with all cuing for proper technique of therex with good motivation and no increased pain throughout session. PT will continue progression as able.    Personal Factors and Comorbidities Time since onset of injury/illness/exacerbation;Comorbidity 1    Comorbidities radial neuritis    Examination-Activity Limitations Sit;Lift    Examination-Participation Restrictions Community Activity    Stability/Clinical Decision Making Evolving/Moderate complexity    Clinical Decision Making Moderate    Rehab Potential Good    PT Frequency 2x / week    PT Duration 8 weeks    PT Treatment/Interventions ADLs/Self Care Home Management;Cryotherapy;Traction;Gait training;Therapeutic exercise;Patient/family education;Manual techniques;Passive range of motion;Dry needling;Moist Heat;DME Instruction;Therapeutic activities;Iontophoresis 4mg /ml Dexamethasone;Electrical Stimulation;Ultrasound;Functional mobility training;Neuromuscular re-education;Joint Manipulations;Spinal Manipulations    PT Next Visit Plan continue with strengthening regimen, progress to    PT Home Exercise Plan Eval: Y 2# DB, low row, UT and levator stretch    Consulted and Agree with Plan of Care Patient              DPT Hilda Lias, PT 03/06/2022, 10:08 AM

## 2022-03-11 ENCOUNTER — Encounter: Payer: Self-pay | Admitting: Physical Therapy

## 2022-03-11 ENCOUNTER — Ambulatory Visit: Payer: Self-pay | Admitting: Physical Therapy

## 2022-03-11 DIAGNOSIS — M25611 Stiffness of right shoulder, not elsewhere classified: Secondary | ICD-10-CM

## 2022-03-11 NOTE — Therapy (Addendum)
OUTPATIENT PHYSICAL THERAPY TREATMENT NOTE   Patient Name: Erin Parsons MRN: [id number] DOB:[birth year], 21 y.o., female Today's Date: 03/11/2022  PCP: Bronson Ing MD REFERRING PROVIDER: Dorthula Nettles DO    PT End of Session - 03/11/22 0800     Visit Number 5    Number of Visits 18    Date for PT Re-Evaluation 04/17/22    Authorization Time Period 02/14/22-04/16/22    Authorization - Visit Number 5    Progress Note Due on Visit 10    PT Start Time 0755    PT Stop Time 0820    PT Time Calculation (min) 25 min    Activity Tolerance Patient tolerated treatment well;No increased pain    Behavior During Therapy [facility] for tasks assessed/performed              History reviewed. No pertinent past medical history. History reviewed. No pertinent surgical history. There are no problems to display for this patient.   REFERRING DIAG: R shoulder pain  THERAPY DIAG:  Stiffness of right shoulder, not elsewhere classified  Rationale for Evaluation and Treatment Rehabilitation  PERTINENT HISTORY: Patinet is a 21 year old female familiar with this clinic from recenting d/c OT for R radial neuritis. She had some shoulder pain at beginning of OT, but says it has gotten worse since she has not been using it as much due to healing from neutritis. Pain is localized to superior/posterior shoulder and UT. Reports this is achy and reports her UT feels really tight. She cannot localize any activities that make her pain better or worse. Current and worst pain 1/10; best 0/10. Has not tried any modalities to help with shoulder discomfort . She notices some weakness in her R shoulder, and that it is lower than her L shoulder, and she feels like she is weak when serving for tennis. Pt is attending [employer] this August. 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 Pt is R handed.Still has some numbness from neurtiis with writing or playing piano for extended period  of time Pt denies N/V, B&B changes, unexplained weight fluctuation, saddle paresthesia, fever, night sweats, or unrelenting night pain at this time.  PRECAUTIONS: none  SUBJECTIVE: Pt reports she is doing better overall, she has no pain today. She still reports some weakness with lifting with her RUE, but that at rest she does not notice her shoulder is "lower". She played tennis yesterday and noticed some shoulder hiking with her swing.   PAIN:  Are you having pain? No     TODAY'S TREATMENT:  Ther-Ex Nustep L4 cuing for SPM above 70 for gentle pro/retraction strengthening   Scap taps from full plank 2x 12 with min cuing for set up with good carry over  Bear crawls x21ft; with 10# plate on back S93 with increased difficulty maintaining d/t trunk rotation   Figure 8 2KG 2x 30sec swings with difficulty with sequencing initially with good carry over following  Ball slam 15# 3x 6 with good carry over of initial demo   UT stretch 30sec Levator stretch 30sec Rhomboid/mid trap stretch doorway 30sec     PATIENT EDUCATION: Education details: therex form/technique Person educated: Patient Education method: Programmer, multimedia, Demonstration, and Verbal cues Education comprehension: verbalized understanding, returned demonstration, and tactile cues required   HOME EXERCISE PROGRAM: Prone Y 2# DB Low row (band) UT and levator stretch   PT Short Term Goals - 02/14/22 1044  PT SHORT TERM GOAL #1   Title Pt will be independent with HEP in order to improve strength and decrease pain in order to improve pain-free function at home and work.    Baseline 02/14/22 HEP given    Time 4    Period Weeks    Status New              PT Long Term Goals - 02/14/22 1045       PT LONG TERM GOAL #1   Title Pt will demonstrate gross periscapular strength of 5/5 in order to demonstrate PLOF and strength needed for tennis serve    Baseline 02/14/22 Y lower trap 4/5; T scap retractors 4+/5     Time 8    Period Weeks    Status New      PT LONG TERM GOAL #2   Title Patient will increase FOTO score to 77  to demonstrate predicted increase in function to complete ADLs    Baseline 02/14/22 55    Time 8    Period Weeks    Status New              Plan - 03/11/22 2025     Clinical Impression Statement PT continued therex progression for increased shoulder/scapular strength and stability with success. Patient demonstrating some increased shoulder hiking compensation that she is able to take note of and correct. Patient is able to comply with all cuing for proper technique of therex with no increased pain throughout session. PT continued education on stretching for HEP to relieve tension. PT will continue progression as able.    Personal Factors and Comorbidities Time since onset of injury/illness/exacerbation;Comorbidity 1    Comorbidities radial neuritis    Examination-Activity Limitations Sit;Lift    Examination-Participation Restrictions Community Activity    Stability/Clinical Decision Making Evolving/Moderate complexity    Clinical Decision Making Moderate    Rehab Potential Good    PT Frequency 2x / week    PT Duration 8 weeks    PT Treatment/Interventions ADLs/Self Care Home Management;Cryotherapy;Traction;Gait training;Therapeutic exercise;Patient/family education;Manual techniques;Passive range of motion;Dry needling;Moist Heat;DME Instruction;Therapeutic activities;Iontophoresis 4mg /ml Dexamethasone;Electrical Stimulation;Ultrasound;Functional mobility training;Neuromuscular re-education;Joint Manipulations;Spinal Manipulations    PT Next Visit Plan continue with strengthening regimen, progress to    PT Home Exercise Plan Eval: Y 2# DB, low row, UT and levator stretch    Consulted and Agree with Plan of Care Patient               DPT Hilda Lias, PT 03/11/2022, 8:35 AM

## 2023-10-20 ENCOUNTER — Other Ambulatory Visit: Payer: Self-pay

## 2023-10-20 DIAGNOSIS — R2242 Localized swelling, mass and lump, left lower limb: Secondary | ICD-10-CM

## 2023-10-27 ENCOUNTER — Ambulatory Visit
Admission: RE | Admit: 2023-10-27 | Discharge: 2023-10-27 | Disposition: A | Discharge status: Home / Self Care | Payer: Self-pay | Source: Ambulatory Visit

## 2023-10-27 DIAGNOSIS — R2242 Localized swelling, mass and lump, left lower limb: Secondary | ICD-10-CM

## 2023-11-06 ENCOUNTER — Other Ambulatory Visit: Payer: Self-pay

## 2023-11-06 DIAGNOSIS — R2242 Localized swelling, mass and lump, left lower limb: Secondary | ICD-10-CM

## 2023-11-19 ENCOUNTER — Ambulatory Visit
Admission: RE | Admit: 2023-11-19 | Discharge: 2023-11-19 | Disposition: A | Discharge status: Home / Self Care | Payer: Self-pay | Source: Ambulatory Visit

## 2023-11-19 DIAGNOSIS — R2242 Localized swelling, mass and lump, left lower limb: Secondary | ICD-10-CM

## 2024-01-21 ENCOUNTER — Ambulatory Visit (INDEPENDENT_AMBULATORY_CARE_PROVIDER_SITE_OTHER): Payer: Self-pay | Admitting: Vascular Surgery

## 2024-01-21 ENCOUNTER — Encounter (INDEPENDENT_AMBULATORY_CARE_PROVIDER_SITE_OTHER): Payer: Self-pay | Admitting: Vascular Surgery

## 2024-01-21 VITALS — BP 94/68 | HR 94 | Resp 18 | Ht 70.0 in | Wt 139.0 lb

## 2024-01-21 DIAGNOSIS — S7012XS Contusion of left thigh, sequela: Secondary | ICD-10-CM

## 2024-02-06 ENCOUNTER — Encounter (INDEPENDENT_AMBULATORY_CARE_PROVIDER_SITE_OTHER): Payer: Self-pay | Admitting: Vascular Surgery

## 2024-02-06 DIAGNOSIS — S7012XA Contusion of left thigh, initial encounter: Secondary | ICD-10-CM | POA: Insufficient documentation

## 2024-02-06 NOTE — Progress Notes (Signed)
 MRN : [id number]  Erin Parsons is a 23 y.o. ([birth year]) female who presents with chief complaint of legs hurt and swell.  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  Duplex ultrasound is also obtained 10/27/2023 shows the hematoma no obvious vascular issue.  No outpatient medications have been marked as taking for the 01/21/24 encounter (Office Visit) with Jama, Cordella MATSU, MD.    History reviewed. No pertinent past medical history.  History reviewed. No pertinent surgical history.  Social History Social History   Tobacco Use   Smoking status: Never   Smokeless tobacco: Never  Vaping Use   Vaping status: Never Used  Substance Use Topics   Drug use: Never    Family History Family History  Problem Relation Age of Onset   Varicose Veins Mother    Hemachromatosis Father     Allergies  Allergen Reactions   Ibuprofen Hives     REVIEW OF SYSTEMS (Negative unless checked)  Constitutional: [] Weight loss  [] Fever  [] Chills Cardiac: [] Chest pain   [] Chest pressure   [] Palpitations   [] Shortness of breath when laying flat   [] Shortness of breath with exertion. Vascular:  [] Pain in legs with walking   [x] Pain in legs at rest  [] History of DVT   [] Phlebitis   [x] Swelling in legs   [] Varicose veins   [] Non-healing ulcers Pulmonary:   [] Uses home oxygen   [] Productive cough   [] Hemoptysis   [] Wheeze  [] COPD   [] Asthma Neurologic:  [] Dizziness    [] Seizures   [] History of stroke   [] History of TIA  [] Aphasia   [] Vissual changes   [] Weakness or numbness in arm   [] Weakness or numbness in leg Musculoskeletal:   [] Joint swelling   [] Joint pain   [] Low back pain Hematologic:  [] Easy bruising  [] Easy bleeding   [] Hypercoagulable state   [] Anemic Gastrointestinal:  [] Diarrhea   [] Vomiting  [] Gastroesophageal reflux/heartburn   [] Difficulty swallowing. Genitourinary:  [] Chronic kidney disease   [] Difficult urination  [] Frequent urination   [] Blood in urine Skin:  [] Rashes   [] Ulcers  Psychological:  [] History of anxiety   []  History of major depression.  Physical Examination  Vitals:   01/21/24 1401  BP: 94/68  Pulse: 94  Resp: 18  Weight: 139 lb (63 kg)  Height: 5' 10 (1.778 m)   Body mass index is 19.94 kg/m. Gen: WD/WN, NAD Head: [place]/AT, No temporalis wasting.  Ear/Nose/Throat: Hearing grossly intact, nares w/o erythema or drainage, pinna without lesions Eyes: PER, EOMI, sclera nonicteric.  Neck: Supple, no gross masses.  No JVD.  Pulmonary:  Good air movement, no audible wheezing, no use of accessory muscles.  Cardiac: RRR, precordium not hyperdynamic. Vascular: No obvious deformity of the left thigh no evidence of venous changes or varicosity Vessel Right Left  Radial Palpable Palpable  Gastrointestinal: soft, non-distended. No guarding/no peritoneal signs.  Musculoskeletal: M/S 5/5 throughout.  No deformity.  Neurologic: CN 2-12 intact. Pain and light touch intact in extremities.  Symmetrical.  Speech is fluent. Motor exam as listed above. Psychiatric: Judgment intact, Mood & affect appropriate for pt's clinical situation. Dermatologic: Venous rashes no ulcers noted.  No changes consistent with cellulitis. Lymph : No lichenification or skin changes of chronic lymphedema.  CBC No results found for: WBC, HGB, HCT, MCV, PLT  BMET No results found for: NA, K, CL, CO2, GLUCOSE, BUN, CREATININE,  CALCIUM, GFRNONAA, GFRAA CrCl cannot be calculated (No successful lab value found.).  COAG No results found for: INR, PROTIME  Radiology No results found.   Assessment/Plan 1. Hematoma of left thigh, sequela (Primary) There does not appear to be a vascular etiology or significant vascular component to this mass within the left thigh.  Furthermore, comparison to past studies does not show any increase in size or significant changes essentially eliminating malignancy as a potential etiology.  Most likely this is an issue of nonabsorbed hematoma.  I have called Dr. Lorelle to discuss it.  I believe this is an orthopedic issue and not a vascular issue and if encouraged him to move forward with his treatment plan.  I have asked that she follow-up with me as needed    Cordella Shawl, MD  02/06/2024 2:34 PM

## 2024-05-16 ENCOUNTER — Other Ambulatory Visit: Payer: Self-pay | Admitting: Orthopedic Surgery

## 2024-05-17 ENCOUNTER — Other Ambulatory Visit: Payer: Self-pay | Admitting: Orthopedic Surgery

## 2024-05-27 ENCOUNTER — Encounter
Admission: RE | Admit: 2024-05-27 | Discharge: 2024-05-27 | Disposition: A | Discharge status: Home / Self Care | Payer: Self-pay | Source: Ambulatory Visit | Attending: Orthopedic Surgery | Admitting: Orthopedic Surgery

## 2024-05-27 ENCOUNTER — Other Ambulatory Visit: Payer: Self-pay

## 2024-05-27 DIAGNOSIS — Z01812 Encounter for preprocedural laboratory examination: Secondary | ICD-10-CM

## 2024-05-27 HISTORY — DX: Other chronic pain: G89.29

## 2024-05-27 HISTORY — DX: Contusion of left thigh, initial encounter: S70.12XA

## 2024-05-27 NOTE — Patient Instructions (Addendum)
 Your procedure is scheduled on: 06/02/24 - Thursday Report to the Registration Desk on the 1st floor of the [facility]. To find out your arrival time, please call [phone] between 1PM - 3PM on: 06/01/24 - Wednesday If your arrival time is 6:00 am, do not arrive before that time as the [facility] entrance doors do not open until 6:00 am.  REMEMBER: Instructions that are not followed completely may result in serious medical risk, up to and including death; or upon the discretion of your surgeon and anesthesiologist your surgery may need to be rescheduled.  Do not eat food after midnight the night before surgery.  No gum chewing or hard candies.  You may however, drink CLEAR liquids up to 2 hours before you are scheduled to arrive for your surgery. Do not drink anything within 2 hours of your scheduled arrival time.  Clear liquids include: - water  - apple juice without pulp - gatorade (not RED colors) - black coffee or tea (Do NOT add milk or creamers to the coffee or tea) Do NOT drink anything that is not on this list.  In addition, your doctor has ordered for you to drink the provided:  Ensure Pre-Surgery Clear Carbohydrate Drink  Drinking this carbohydrate drink up to two hours before surgery helps to reduce insulin resistance and improve patient outcomes. Please complete drinking 2 hours before scheduled arrival time.  One week prior to surgery: Stop Anti-inflammatories (NSAIDS) such as Advil, Aleve, Ibuprofen, Motrin, Naproxen, Naprosyn and Aspirin based products such as Excedrin, Goody's Powder, BC Powder. You may take Tylenol if needed for pain up until the day of surgery.  Stop ANY OVER THE COUNTER supplements until after surgery : Vitamin C and Vitamin D  ON THE DAY OF SURGERY ONLY TAKE THESE MEDICATIONS WITH SIPS OF WATER:   none  No Alcohol for 24 hours before or after surgery.  No Smoking including e-cigarettes for 24 hours before surgery.  No chewable tobacco  products for at least 6 hours before surgery.  No nicotine patches on the day of surgery.  Do not use any recreational drugs for at least a week (preferably 2 weeks) before your surgery.  Please be advised that the combination of cocaine and anesthesia may have negative outcomes, up to and including death. If you test positive for cocaine, your surgery will be cancelled.  On the morning of surgery brush your teeth with toothpaste and water, you may rinse your mouth with mouthwash if you wish. Do not swallow any toothpaste or mouthwash.  Use CHG Soap or wipes as directed on instruction sheet.  Do not wear jewelry, make-up, hairpins, clips or nail polish.  For welded (permanent) jewelry: bracelets, anklets, waist bands, etc.  Please have this removed prior to surgery.  If it is not removed, there is a chance that hospital personnel will need to cut it off on the day of surgery.  Do not wear lotions, powders, or perfumes.   Do not shave body hair from the neck down 48 hours before surgery.  Contact lenses, hearing aids and dentures may not be worn into surgery.  Do not bring valuables to the hospital. [facility] is not responsible for any missing/lost belongings or valuables.   Notify your doctor if there is any change in your medical condition (cold, fever, infection).  Wear comfortable clothing (specific to your surgery type) to the hospital.  After surgery, you can help prevent lung complications by doing breathing exercises.  Take deep  breaths and cough every 1-2 hours. Your doctor may order a device called an Incentive Spirometer to help you take deep breaths.  When coughing or sneezing, hold a pillow firmly against your incision with both hands. This is called "splinting." Doing this helps protect your incision. It also decreases belly discomfort.  If you are being admitted to the hospital overnight, leave your suitcase in the car. After surgery it may be brought to your  room.  In case of increased patient census, it may be necessary for you, the patient, to continue your postoperative care in the Same Day Surgery department.  If you are being discharged the day of surgery, you will not be allowed to drive home. You will need a responsible individual to drive you home and stay with you for 24 hours after surgery.   If you are taking public transportation, you will need to have a responsible individual with you.  Please call the Pre-admissions Testing Dept. at [phone] if you have any questions about these instructions.  Surgery Visitation Policy:  Patients having surgery or a procedure may have two visitors.  Children under the age of 97 must have an adult with them who is not the patient.  Inpatient Visitation:    Visiting hours are 7 a.m. to 8 p.m. Up to four visitors are allowed at one time in a patient room. The visitors may rotate out with other people during the day.  One visitor age 85 or older may stay with the patient overnight and must be in the room by 8 p.m.   Merchandiser, retail to address health-related social needs:  [email or web address]

## 2024-06-02 ENCOUNTER — Ambulatory Visit: Payer: Self-pay | Admitting: Urgent Care

## 2024-06-02 ENCOUNTER — Other Ambulatory Visit: Payer: Self-pay

## 2024-06-02 ENCOUNTER — Encounter: Payer: Self-pay | Admitting: Orthopedic Surgery

## 2024-06-02 ENCOUNTER — Encounter
Admission: RE | Disposition: A | Discharge status: Home / Self Care | Payer: Self-pay | Source: Home / Self Care | Attending: Orthopedic Surgery

## 2024-06-02 ENCOUNTER — Ambulatory Visit
Admission: RE | Admit: 2024-06-02 | Discharge: 2024-06-02 | Disposition: A | Discharge status: Home / Self Care | Payer: Self-pay | Attending: Orthopedic Surgery | Admitting: Orthopedic Surgery

## 2024-06-02 ENCOUNTER — Ambulatory Visit: Payer: Self-pay

## 2024-06-02 DIAGNOSIS — X58XXXA Exposure to other specified factors, initial encounter: Secondary | ICD-10-CM | POA: Insufficient documentation

## 2024-06-02 DIAGNOSIS — Z886 Allergy status to analgesic agent status: Secondary | ICD-10-CM | POA: Insufficient documentation

## 2024-06-02 DIAGNOSIS — Z01812 Encounter for preprocedural laboratory examination: Secondary | ICD-10-CM

## 2024-06-02 DIAGNOSIS — S7012XA Contusion of left thigh, initial encounter: Secondary | ICD-10-CM | POA: Insufficient documentation

## 2024-06-02 HISTORY — PX: INCISION AND DRAINAGE OF HEMATOMA, THIGH: SHX7378

## 2024-06-02 LAB — POCT PREGNANCY, URINE: Preg Test, Ur: NEGATIVE

## 2024-06-02 SURGERY — INCISION AND DRAINAGE OF HEMATOMA, THIGH
Anesthesia: General | Site: Thigh | Laterality: Left

## 2024-06-02 MED ORDER — ORAL CARE MOUTH RINSE: 15.0000 mL | Freq: Once | OROMUCOSAL | Status: AC

## 2024-06-02 MED ORDER — MIDAZOLAM HCL 2 MG/2ML IJ SOLN
INTRAMUSCULAR | Status: AC
  Filled 2024-06-02: qty 2

## 2024-06-02 MED ORDER — ONDANSETRON HCL 4 MG/2ML IJ SOLN: 4.0000 mg | Freq: Four times a day (QID) | INTRAMUSCULAR | Status: DC | PRN

## 2024-06-02 MED ORDER — LIDOCAINE HCL (CARDIAC) PF 100 MG/5ML IV SOSY
PREFILLED_SYRINGE | INTRAVENOUS | Status: DC | PRN
  Administered 2024-06-02: 100 mg via INTRAVENOUS

## 2024-06-02 MED ORDER — HYDROCODONE-ACETAMINOPHEN 5-325 MG PO TABS: 1.0000 | ORAL_TABLET | ORAL | Status: DC | PRN

## 2024-06-02 MED ORDER — DIPHENHYDRAMINE HCL 50 MG/ML IJ SOLN
INTRAMUSCULAR | Status: AC
  Filled 2024-06-02: qty 1

## 2024-06-02 MED ORDER — ACETAMINOPHEN 10 MG/ML IV SOLN: 1000.0000 mg | Freq: Once | INTRAVENOUS | Status: DC | PRN

## 2024-06-02 MED ORDER — DROPERIDOL 2.5 MG/ML IJ SOLN: 0.6250 mg | Freq: Once | INTRAMUSCULAR | Status: DC | PRN

## 2024-06-02 MED ORDER — CEFAZOLIN SODIUM-DEXTROSE 2-4 GM/100ML-% IV SOLN
2.0000 g | INTRAVENOUS | Status: AC
Start: 2024-06-02 — End: 2024-06-02
  Administered 2024-06-02: 2 g via INTRAVENOUS

## 2024-06-02 MED ORDER — GLYCOPYRROLATE 0.2 MG/ML IJ SOLN
INTRAMUSCULAR | Status: AC
  Filled 2024-06-02: qty 1

## 2024-06-02 MED ORDER — CEFAZOLIN SODIUM-DEXTROSE 2-4 GM/100ML-% IV SOLN: 2.0000 g | INTRAVENOUS | Status: DC

## 2024-06-02 MED ORDER — MIDAZOLAM HCL (PF) 2 MG/2ML IJ SOLN
INTRAMUSCULAR | Status: DC | PRN
  Administered 2024-06-02: 2 mg via INTRAVENOUS

## 2024-06-02 MED ORDER — ACETAMINOPHEN 500 MG PO TABS: 500.0000 mg | ORAL_TABLET | Freq: Four times a day (QID) | ORAL | Status: DC

## 2024-06-02 MED ORDER — PROPOFOL 10 MG/ML IV BOLUS
INTRAVENOUS | Status: AC
  Filled 2024-06-02: qty 20

## 2024-06-02 MED ORDER — ACETAMINOPHEN 325 MG PO TABS: 325.0000 mg | ORAL_TABLET | Freq: Four times a day (QID) | ORAL | Status: DC | PRN

## 2024-06-02 MED ORDER — OXYCODONE HCL 5 MG PO TABS: 5.0000 mg | ORAL_TABLET | Freq: Once | ORAL | Status: DC | PRN

## 2024-06-02 MED ORDER — LIDOCAINE HCL (PF) 2 % IJ SOLN
INTRAMUSCULAR | Status: AC
  Filled 2024-06-02: qty 5

## 2024-06-02 MED ORDER — CHLORHEXIDINE GLUCONATE 0.12 % MT SOLN
OROMUCOSAL | Status: AC
  Filled 2024-06-02: qty 15

## 2024-06-02 MED ORDER — HEMOSTATIC AGENTS (NO CHARGE) OPTIME
TOPICAL | Status: DC | PRN
  Administered 2024-06-02: 1 via TOPICAL

## 2024-06-02 MED ORDER — FENTANYL CITRATE (PF) 100 MCG/2ML IJ SOLN
INTRAMUSCULAR | Status: DC | PRN
  Administered 2024-06-02: 25 ug via INTRAVENOUS
  Administered 2024-06-02: 50 ug via INTRAVENOUS
  Administered 2024-06-02: 25 ug via INTRAVENOUS

## 2024-06-02 MED ORDER — DEXAMETHASONE SOD PHOSPHATE PF 10 MG/ML IJ SOLN
INTRAMUSCULAR | Status: DC | PRN
  Administered 2024-06-02: 10 mg via INTRAVENOUS

## 2024-06-02 MED ORDER — ACETAMINOPHEN 10 MG/ML IV SOLN
INTRAVENOUS | Status: AC
  Filled 2024-06-02: qty 100

## 2024-06-02 MED ORDER — EPHEDRINE 5 MG/ML INJ
INTRAVENOUS | Status: AC
  Filled 2024-06-02: qty 5

## 2024-06-02 MED ORDER — DIPHENHYDRAMINE HCL 50 MG/ML IJ SOLN
INTRAMUSCULAR | Status: DC | PRN
  Administered 2024-06-02: 12.5 mg via INTRAVENOUS

## 2024-06-02 MED ORDER — 0.9 % SODIUM CHLORIDE (POUR BTL) OPTIME
TOPICAL | Status: DC | PRN
  Administered 2024-06-02: 500 mL

## 2024-06-02 MED ORDER — ACETAMINOPHEN 500 MG PO TABS
1000.0000 mg | ORAL_TABLET | Freq: Three times a day (TID) | ORAL | 0 refills | Status: AC | PRN
  Filled 2024-06-02: qty 60, 10d supply, fill #0

## 2024-06-02 MED ORDER — LACTATED RINGERS IV SOLN: INTRAVENOUS | Status: DC

## 2024-06-02 MED ORDER — FENTANYL CITRATE (PF) 100 MCG/2ML IJ SOLN
INTRAMUSCULAR | Status: AC
  Filled 2024-06-02: qty 2

## 2024-06-02 MED ORDER — BUPIVACAINE-EPINEPHRINE 0.5% -1:200000 IJ SOLN
INTRAMUSCULAR | Status: DC | PRN
  Administered 2024-06-02: 20 mL

## 2024-06-02 MED ORDER — OXYCODONE HCL 5 MG/5ML PO SOLN: 5.0000 mg | Freq: Once | ORAL | Status: DC | PRN

## 2024-06-02 MED ORDER — FENTANYL CITRATE (PF) 100 MCG/2ML IJ SOLN: 25.0000 ug | INTRAMUSCULAR | Status: DC | PRN

## 2024-06-02 MED ORDER — ONDANSETRON HCL 4 MG PO TABS: 4.0000 mg | ORAL_TABLET | Freq: Four times a day (QID) | ORAL | Status: DC | PRN

## 2024-06-02 MED ORDER — SODIUM CHLORIDE 0.9 % IR SOLN
Status: DC | PRN
  Administered 2024-06-02: 100 mL
  Administered 2024-06-02: 1000 mL

## 2024-06-02 MED ORDER — CHLORHEXIDINE GLUCONATE 0.12 % MT SOLN
15.0000 mL | Freq: Once | OROMUCOSAL | Status: AC
  Administered 2024-06-02: 15 mL via OROMUCOSAL

## 2024-06-02 MED ORDER — HYDROCODONE-ACETAMINOPHEN 7.5-325 MG PO TABS: 1.0000 | ORAL_TABLET | ORAL | Status: DC | PRN

## 2024-06-02 MED ORDER — DEXMEDETOMIDINE HCL IN NACL 80 MCG/20ML IV SOLN
INTRAVENOUS | Status: DC | PRN
  Administered 2024-06-02 (×3): 4 ug via INTRAVENOUS

## 2024-06-02 MED ORDER — GLYCOPYRROLATE 0.2 MG/ML IJ SOLN
INTRAMUSCULAR | Status: AC
Start: 2024-06-02 — End: 2024-06-02
  Filled 2024-06-02: qty 1

## 2024-06-02 MED ORDER — MORPHINE SULFATE (PF) 2 MG/ML IV SOLN: 0.5000 mg | INTRAVENOUS | Status: DC | PRN

## 2024-06-02 MED ORDER — CEFAZOLIN SODIUM-DEXTROSE 2-4 GM/100ML-% IV SOLN
INTRAVENOUS | Status: AC
  Filled 2024-06-02: qty 100

## 2024-06-02 MED ORDER — GLYCOPYRROLATE 0.2 MG/ML IJ SOLN
INTRAMUSCULAR | Status: DC | PRN
  Administered 2024-06-02 (×2): .1 mg via INTRAVENOUS

## 2024-06-02 MED ORDER — EPHEDRINE SULFATE-NACL 50-0.9 MG/10ML-% IV SOSY
PREFILLED_SYRINGE | INTRAVENOUS | Status: DC | PRN
  Administered 2024-06-02 (×2): 5 mg via INTRAVENOUS

## 2024-06-02 MED ORDER — ONDANSETRON HCL 4 MG/2ML IJ SOLN
INTRAMUSCULAR | Status: DC | PRN
Start: 2024-06-02 — End: 2024-06-02
  Administered 2024-06-02: 4 mg via INTRAVENOUS

## 2024-06-02 MED ORDER — PROPOFOL 10 MG/ML IV BOLUS
INTRAVENOUS | Status: DC | PRN
  Administered 2024-06-02: 200 mg via INTRAVENOUS
  Administered 2024-06-02: 100 mg via INTRAVENOUS

## 2024-06-02 MED ORDER — BUPIVACAINE-EPINEPHRINE (PF) 0.5% -1:200000 IJ SOLN
INTRAMUSCULAR | Status: AC
  Filled 2024-06-02: qty 30

## 2024-06-02 MED ORDER — ACETAMINOPHEN 10 MG/ML IV SOLN
INTRAVENOUS | Status: DC | PRN
Start: 2024-06-02 — End: 2024-06-02
  Administered 2024-06-02: 1000 mg via INTRAVENOUS

## 2024-06-02 MED ORDER — TRAMADOL HCL 50 MG PO TABS
50.0000 mg | ORAL_TABLET | Freq: Four times a day (QID) | ORAL | 0 refills | Status: AC | PRN
  Filled 2024-06-02: qty 30, 8d supply, fill #0

## 2024-06-02 SURGICAL SUPPLY — 53 items
BLADE SURG SZ10 CARB STEEL (BLADE) ×1 IMPLANT
BNDG COHESIVE 6X5 TAN ST LF (GAUZE/BANDAGES/DRESSINGS) IMPLANT
BNDG ELASTIC 3X5.8 VLCR NS LF (GAUZE/BANDAGES/DRESSINGS) IMPLANT
BNDG ELASTIC 4X5.8 VLCR NS LF (GAUZE/BANDAGES/DRESSINGS) ×1 IMPLANT
BNDG ESMARCH 4X12 STRL LF (GAUZE/BANDAGES/DRESSINGS) ×1 IMPLANT
BNDG GAUZE DERMACEA FLUFF 4 (GAUZE/BANDAGES/DRESSINGS) IMPLANT
CHLORAPREP W/TINT 26 (MISCELLANEOUS) ×1 IMPLANT
CORD BIP STRL DISP 12FT (MISCELLANEOUS) ×1 IMPLANT
COVER PROBE FLX POLY STRL (MISCELLANEOUS) IMPLANT
CUFF TOURN SGL QUICK 18X4 (TOURNIQUET CUFF) IMPLANT
CUFF TRNQT CYL 24X4X16.5-23 (TOURNIQUET CUFF) IMPLANT
DERMABOND ADVANCED .7 DNX12 (GAUZE/BANDAGES/DRESSINGS) IMPLANT
DRAIN PENROSE 0.625X18 (DRAIN) IMPLANT
DRSG OPSITE POSTOP 3X4 (GAUZE/BANDAGES/DRESSINGS) IMPLANT
ELECTRODE REM PT RTRN 9FT ADLT (ELECTROSURGICAL) ×1 IMPLANT
FORCEPS JEWEL BIP 4-3/4 STR (INSTRUMENTS) ×1 IMPLANT
GAUZE PACKING IODOFORM 1/2INX (GAUZE/BANDAGES/DRESSINGS) IMPLANT
GAUZE SPONGE 4X4 12PLY STRL (GAUZE/BANDAGES/DRESSINGS) IMPLANT
GLOVE BIO SURGEON STRL SZ8 (GLOVE) ×1 IMPLANT
GLOVE PI ORTHO PRO STRL 7.5 (GLOVE) ×1 IMPLANT
GLOVE SURG LATEX 7.5 PF (GLOVE) ×1 IMPLANT
GOWN SRG XL LONG LVL 3 NONREIN (GOWNS) ×1 IMPLANT
GOWN SRG XL LVL 3 NONREINFORCE (GOWNS) ×1 IMPLANT
HEMOSTAT ARISTA ABSORB 1G (HEMOSTASIS) IMPLANT
IV 0.9% NACL 1000 ML (IV SOLUTION) ×1 IMPLANT
IV CATH ANGIO 14GX3.25 ORG (MISCELLANEOUS) IMPLANT
IV NS 100ML SINGLE PACK (IV SOLUTION) IMPLANT
KIT TURNOVER KIT A (KITS) ×1 IMPLANT
MANIFOLD NEPTUNE II (INSTRUMENTS) ×1 IMPLANT
NDL SAFETY ECLIP 18X1.5 (MISCELLANEOUS) ×1 IMPLANT
NS IRRIG 500ML POUR BTL (IV SOLUTION) IMPLANT
PACK EXTREMITY ARMC (MISCELLANEOUS) ×1 IMPLANT
PENCIL SMOKE EVACUATOR (MISCELLANEOUS) IMPLANT
SLEEVE SCD COMPRESS KNEE MED (STOCKING) IMPLANT
SOLN 0.9% NACL 1000 ML (IV SOLUTION) IMPLANT
SOLN 0.9% NACL POUR BTL 1000ML (IV SOLUTION) ×1 IMPLANT
SOLUTION PREP PVP 2OZ (MISCELLANEOUS) ×1 IMPLANT
SPONGE T-LAP 18X18 ~~LOC~~+RFID (SPONGE) IMPLANT
STOCKINETTE IMPERVIOUS 9X36 MD (GAUZE/BANDAGES/DRESSINGS) IMPLANT
SUT MNCRL AB 3-0 PS2 27 (SUTURE) IMPLANT
SUT VIC AB 0 CT1 36 (SUTURE) IMPLANT
SUT VIC AB 2-0 CT2 27 (SUTURE) IMPLANT
SUTURE ETHLN 4-0 FS2 18XMF BLK (SUTURE) IMPLANT
SUTURE STRATA SPIR 4-0 18 (SUTURE) IMPLANT
SWAB CULTURE AMIES ANAERIB BLU (MISCELLANEOUS) IMPLANT
SYR 10ML LL (SYRINGE) ×1 IMPLANT
SYR 20ML LL LF (SYRINGE) ×1 IMPLANT
TIP FAN IRRIG PULSAVAC PLUS (DISPOSABLE) IMPLANT
TOWEL OR 17X26 4PK STRL BLUE (TOWEL DISPOSABLE) IMPLANT
TRAP FLUID SMOKE EVACUATOR (MISCELLANEOUS) ×1 IMPLANT
TUBING CONNECTING 10 (TUBING) ×1 IMPLANT
WAND WEREWOLF FASTSEAL 6.0 (MISCELLANEOUS) IMPLANT
WATER STERILE IRR 500ML POUR (IV SOLUTION) ×1 IMPLANT

## 2024-06-02 NOTE — Anesthesia Preprocedure Evaluation (Signed)
 Anesthesia Evaluation  Patient identified by MRN, date of birth, ID band Patient awake    Reviewed: Allergy & Precautions, H&P , NPO status , Patient's Chart, lab work & pertinent test results, reviewed documented beta blocker date and time   Airway Mallampati: II  TM Distance: >3 FB Neck ROM: full    Dental  (+) Teeth Intact   Pulmonary neg pulmonary ROS   Pulmonary exam normal        Cardiovascular Exercise Tolerance: Good negative cardio ROS Normal cardiovascular exam Rate:Normal     Neuro/Psych Hx. 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  negative neurological ROS  negative psych ROS   GI/Hepatic negative GI ROS, Neg liver ROS,,,  Endo/Other  negative endocrine ROS    Renal/GU negative Renal ROS  negative genitourinary   Musculoskeletal   Abdominal   Peds  Hematology negative hematology ROS (+)   Anesthesia Other Findings   Reproductive/Obstetrics negative OB ROS                              Anesthesia Physical Anesthesia Plan  ASA: 2  Anesthesia Plan: General LMA   Post-op Pain Management:    Induction:   PONV Risk Score and Plan: 4 or greater and TIVA  Airway Management Planned:   Additional Equipment:   Intra-op Plan:   Post-operative Plan:   Informed Consent: I have reviewed the patients History and Physical, chart, labs and discussed the procedure including the risks, benefits and alternatives for the proposed anesthesia with the patient or authorized representative who has indicated his/her understanding and acceptance.       Plan Discussed with: CRNA  Anesthesia Plan Comments:         Anesthesia Quick Evaluation

## 2024-06-02 NOTE — Op Note (Signed)
 Patient Name: Erin Parsons  MRN: [id number]  Pre-Operative Diagnosis: Left distal lateral thigh hematoma, chronic  Post-Operative Diagnosis: Left distal thigh hematoma, chronic with venous vessel confluence  Procedure: Left distal thigh hematoma irrigation and debridement with hemostatic application and vessel ligation  Components/Implants: none  Date of Surgery: 06/02/2024  Surgeon: Arthea Sheer MD  Assistant: Debby Amber PA (present and scrubbed throughout the case, critical for assistance with exposure, retraction, instrumentation, and closure)   Anesthesiologist: Adams  Anesthesia: General   EBL: 25cc  Complications: None   Brief history: The patient is a 23 year old female who has a chronic hematoma of the left distal thigh.  The risks and benefits of irrigation and debridement and dead space closure were discussed with the patient who agreed with the plan to proceed with the procedure.  After outpatient optimization was completed the patient was admitted to [facility] for the procedure.  All preoperative films were reviewed and an appropriate surgical plan was made prior to surgery.   Description of procedure: The patient was brought to the operating room where laterality was confirmed by all those present to be the left thigh.  General anesthesia was administered and the patient received an intravenous dose of antibiotics for surgical prophylaxis.  Patient is positioned supine on the operating room table with all bony prominences well-padded.  The lateral left thigh and knee were examined in the operating room with an ultrasound and the superior and inferior borders of the hematoma were marked with a marking pen.  An incision was marked out directly over the center of the hematoma.    The knee was then prepped and draped in usual sterile fashion with multiple layers of adhesive and nonadhesive drapes.  All of those present in the operating room  participated in a surgical timeout laterality and patient were confirmed.    A longitudinal incision was made over the center of the area of fullness as previously demarcated by the ultrasound.  Careful dissection was taken with electrocautery down to the level of the vastus lateralis fascia.  A inline incision was made along the fibers of the vastus lateralis over the central area of fluctuance.  Upon entering the fascia there was a large gush of bright red blood as we entered the hematoma.  There was a large serpiginous vessel venous in nature along the posterior border of the cavity with active oozing.  The cavity was carefully dissected out and washed with pulsatile lavage.  Bleeding vessels were addressed with watercooled bipolar electrocautery and Bovie cautery.  Vicryl sutures were also used to tie around the tissue with the vessel.  The area was then irrigated again.  There was no active bleeding remaining.  The area was covered with Arista hemostatic agent and a deep suture was placed to close the remaining dead space deep to the fascia.  The fascia was then closed with 0 Vicryl interrupted figure-of-eight sutures.  The subcutaneous tissues were closed with 0 Vicryl, 2-0 Vicryl, and a running subcuticular 4 oh STRATAFIX suture.  The skin was glued with Dermabond.  A sterile adhesive dressing was placed along with a compressive dressing with gauze and a Ace wrap.  Sponge, needle, and Lap counts were all correct at the end of the case.   The patient was transferred off of the operating room table to a hospital bed, good pulses were found distally on the operative side.  The patient was transferred to the recovery room in stable condition.

## 2024-06-02 NOTE — Interval H&P Note (Signed)
 Patient history and physical updated. Consent reviewed including risks, benefits, and alternatives to surgery. Patient agrees with above plan to proceed with left thigh hematoma I&D.

## 2024-06-02 NOTE — H&P (Signed)
 History of Present Illness: The patient is an 23 y.o. female seen in clinic today for a history and physical for an upcoming incision and drainage to be done by Dr. Lorelle June 02, 2024. The patient saw Dr. Lorelle last month for follow-up evaluation of her left leg. Patient continues to have swelling over her lateral distal thigh and reports that it gets bigger and smaller randomly and causes discomfort when walking on flat ground going up stairs. With extended walking she develops more pain radiating down to her lateral anterior tibia. She does report intermittent posterior lateral knee pain as well which is worse with pivoting and is not significantly changed from her last visit. Patient was seen by vascular surgery who evaluated the swelling in her leg and do not believe it is vascular in origin and is likely a organized hematoma at this point. The patient denies fevers, chills, numbness, tingling, shortness of breath, chest pain, recent illness, or any trauma. The patient is not a diabetic. She does have an allergy to ibuprofen.  Past Medical History: No past medical history on file.  Past Surgical History: History reviewed. No pertinent surgical history.  Past Family History: Family History  Problem Relation Age of Onset  No Known Problems Mother   Medications: No current outpatient medications on file.   No current facility-administered medications for this visit.   Allergies: Allergies  Allergen Reactions  Ibuprofen Hives    Visit Vitals: Vitals:  05/23/24 1055  BP: 118/70     Review of Systems:  A comprehensive 14 point ROS was performed, reviewed, and the pertinent orthopaedic findings are documented in the HPI.  Physical Exam: General/Constitutional: No apparent distress: well-nourished and well developed. Eyes: Pupils equal, round with synchronous movement. Respiratory: Non-labored breathing. Cardiac: Heart rate is regular. Integumentary: No impressive skin  lesions present, except as noted in detailed exam. Neuro/Psych: Normal mood and affect, oriented to person, place and time.  Heart: Examination of the heart reveals regular, rate, and rhythm. There is no murmur noted on ascultation. There is a normal apical pulse.  Lungs: Lungs are clear to auscultation. There is no wheeze, rhonchi, or crackles. There is normal expansion of bilateral chest walls.   Comprehensive Knee Exam: Gait Non-antalgic and fluid  Alignment Neutral   Inspection Right Left  Skin Normal appearance with no obvious deformity. No ecchymosis or erythema. Normal appearance with no obvious deformity. No ecchymosis or erythema.  Soft Tissue No focal soft tissue swelling Mild soft tissue fullness superior lateral to the knee over the distal vastus lateralis  Quad Atrophy None None   Palpation  Right Left  Tenderness No peripatellar, patellar tendon, quad tendon, medial/lateral joint line pain No joint line tenderness tenderness over the fullness in the lateral distal thigh some reproducible tenderness over the distal biceps just posterior to the area of fullness  Crepitus No patellofemoral or tibiofemoral crepitus No patellofemoral or tibiofemoral crepitus  Effusion None None   Range of Motion Right Left  Flexion 0-130 0-130  Extension Full knee extension without hyperextension Full knee extension without hyperextension   Ligamentous Exam Right Left  Lachman Normal Normal  Valgus 0 Normal Normal  Valgus 30 Normal Normal  Varus 0 Normal Normal  Varus 30 Normal Normal  Anterior Drawer Normal Normal  Posterior Drawer Normal Normal   Meniscal Exam Right Left  Hyperflexion Test Negative Negative  Hyperextension Test Negative Negative  McMurray's Negative Negative   Patellofemoral Exam Right Left  Pain Over Facets No medial/lateral patellar  facet pain No medial/lateral patellar facet pain  Moving Patellar Apprehension Negative Negative  J-Sign Negative Negative   Excessive Tilt Negative Negative  Patellar Mobility 2 quadrants medial/lateral 2 quadrants medial/lateral   Neurovascular Right Left  Quadriceps Strength 5/5 5/5  Hamstring Strength 5/5 5/5  Hip Abductor Strength 4/5 4/5  Distal Motor Normal Normal  Distal Sensory Normal light touch sensation Normal light touch sensation  Distal Pulses Normal Normal    Imaging Studies: I reviewed AP and lateral views of the left femur proximal and distal moving the hip and knee taken at the last office visit images reviewed by myself. There are no bony lesions or soft tissue lesions noted joint spaces maintained about the no fractures or dislocations noted normal x-rays.  MRI of the right knee performed 12/08/2023 images and report reviewed by myself. There is a complex heterogeneous fluid-filled lesion over the lateral distal femur at the distal aspect of the myotendinous junction of the vastus lateralis. Potentially a hematoma however on 1 or 2 views there is a slightly serpiginous appearance which could be vascular in nature. Generally agree with radiologist interpretation grossly unchanged from prior MRI 4 years ago.  Narrative & Impression  CLINICAL DATA: Distal lateral left leg mass. History of prior injury in 2020.  EXAM: MR OF THE LEFT FEMUR WITHOUT CONTRAST  TECHNIQUE: Multiplanar, multisequence MR imaging of the left thigh was performed. No intravenous contrast was administered.  COMPARISON: MRI left knee 09/30/2019  FINDINGS: The knee joints are maintained. The hip joints are not included. Both femurs are normal. Normal marrow signal. No bone lesions or stress fracture.  The patient's palpable abnormality is marked with vitamin-E capsules and corresponds to a complex multi septated cystic appearing lesion within the substance of the vastus lateralis muscle. This has areas of heterogeneous intermediate and slight increased T1 signal intensity and areas of increased and decreased T2  signal intensity. No surrounding inflammatory changes in the muscle. When compared to the prior knee MRI from 2021 this appears largely unchanged. It measures approximately 4.5 x 3.7 x 1.6 cm. Findings are most consistent with a prior muscle tear along the distal musculotendinous junction resulting in a large intramuscular hematoma which is largely liquified.  The other thigh musculature is normal.  No subcutaneous lesions are identified. No fascial defects or fascial herniation. The quadriceps and patellar tendons are intact.  IMPRESSION: 1. The patient's palpable abnormality corresponds to a complex multi septated cystic appearing lesion within the substance of the vastus lateralis muscle. Findings are most consistent with a prior muscle tear along the distal musculotendinous junction resulting in a large intramuscular hematoma which is largely liquified. No significant change since 2021. 2. No surrounding inflammatory changes in the muscle. 3. No subcutaneous lesions are identified. 4. No bone lesions or stress fracture.   Electronically Signed By: MYRTIS Stammer M.D. On: 12/08/2023 16:22   Assessment:  Left thigh hematoma/lateral knee tendinitis  Plan: I reviewed the clinical and radiographic findings with the patient. We also discussed her recent vascular surgery evaluation. At this point I feel pretty confident that the majority of the pain is coming from intermittent recurrent swelling of this organized hematoma over the lateral distal thigh which is displacing her IT band and her biceps femoris. We discussed the different treatment options and potential surgical interventions. We could consider a office aspiration with local anesthesia versus an open surgical debridement. There would be a higher likelihood of successful removing the entire hematoma with an open procedure. Largest risk  with post procedures would be recurrence of the hematoma. With an open procedure we would be  able to close down the dead space and apply hemostatic agents to help reduce that risk.   All of her questions were answered and the risks and benefits of each procedure were reviewed in depth including infection, bleeding, bruising, recurrence, need for additional procedures, damage to surrounding structures, and the risks of anesthesia.  All questions answered patient agrees with plan for left thigh hematoma I&D.

## 2024-06-02 NOTE — Transfer of Care (Signed)
 Immediate Anesthesia Transfer of Care Note  Patient: Erin Parsons  Procedure(s) Performed: INCISION AND DRAINAGE OF HEMATOMA, THIGH (Left: Thigh)  Patient Location: PACU  Anesthesia Type:General  Level of Consciousness: awake and drowsy  Airway & Oxygen Therapy: Patient Spontanous Breathing and Patient connected to nasal cannula oxygen  Post-op Assessment: Report given to RN and Post -op Vital signs reviewed and stable  Post vital signs: Reviewed and stable  Last Vitals:  Vitals Value Taken Time  BP 98/52 06/02/24 13:38  Temp    Pulse 65 06/02/24 13:40  Resp 12 06/02/24 13:40  SpO2 100 % 06/02/24 13:40  Vitals shown include unfiled device data.  Last Pain:  Vitals:   06/02/24 1108  TempSrc: Temporal  PainSc: 3          Complications: No notable events documented.

## 2024-06-02 NOTE — Discharge Instructions (Signed)
 Diet: As you were doing prior to hospitalization   Dressing: Please keep Ace wrap over your incision site at all times for the first 3 days following surgery.  On 06/06/2024 you may remove the Ace wrap to shower but please reapply Ace wrap after showering and continue with compression for total of 1 week.  When showering, you can shower with honeycomb dressing over the incision site.  We will remove honeycomb dressing at your 2-week postop visit.  Activity: Avoid any unnecessary physical activity until 2 weeks postop.  Do not bend your left knee past 90 degrees of flexion for 2 weeks.  Weight Bearing:   Weight bearing as tolerated to left lower extremity  To prevent constipation: you may use a stool softener such as -  Colace (over the counter) 100 mg by mouth twice a day  Drink plenty of fluids (prune juice may be helpful) and high fiber foods Miralax (over the counter) for constipation as needed.    Itching:  If you experience itching with your medications, try taking only a single pain pill, or even half a pain pill at a time.  You may take up to 10 pain pills per day, and you can also use benadryl over the counter for itching or also to help with sleep.   Precautions:  If you experience chest pain or shortness of breath - call 911 immediately for transfer to the hospital emergency department!!  If you develop a fever greater that 101 F, purulent drainage from wound, increased redness or drainage from wound, or calf pain-Call [facility]                                               Follow- Up Appointment:  Please call for an appointment to be seen in 2 weeks at [facility]

## 2024-06-03 ENCOUNTER — Encounter: Payer: Self-pay | Admitting: Orthopedic Surgery

## 2024-06-03 NOTE — Anesthesia Postprocedure Evaluation (Signed)
 Anesthesia Post Note  Patient: Erin Parsons  Procedure(s) Performed: INCISION AND DRAINAGE OF HEMATOMA, THIGH (Left: Thigh)  Patient location during evaluation: PACU Anesthesia Type: General Level of consciousness: awake and alert Pain management: pain level controlled Vital Signs Assessment: post-procedure vital signs reviewed and stable Respiratory status: spontaneous breathing, nonlabored ventilation, respiratory function stable and patient connected to nasal cannula oxygen Cardiovascular status: blood pressure returned to baseline and stable Postop Assessment: no apparent nausea or vomiting Anesthetic complications: no   No notable events documented.   Last Vitals:  Vitals:   06/02/24 1415 06/02/24 1445  BP: 103/76 110/71  Pulse: (!) 58 64  Resp: 14 18  Temp: (!) 36.2 C 37 C  SpO2: 100% 100%    Last Pain:  Vitals:   06/02/24 1445  TempSrc: Temporal  PainSc: 0-No pain                 Lynwood KANDICE Clause

## 2024-09-16 ENCOUNTER — Other Ambulatory Visit: Payer: Self-pay
